# Patient Record
Sex: Male | Born: 1960 | Hispanic: Yes | Marital: Married | Smoking: Former smoker
Health system: Southern US, Community
[De-identification: ages and names within clinical notes are randomized; demographics above are authoritative.]

## PROBLEM LIST (undated history)

## (undated) DIAGNOSIS — M2550 Pain in unspecified joint: Secondary | ICD-10-CM

## (undated) DIAGNOSIS — Z9289 Personal history of other medical treatment: Secondary | ICD-10-CM

## (undated) DIAGNOSIS — R519 Headache, unspecified: Secondary | ICD-10-CM

## (undated) DIAGNOSIS — R609 Edema, unspecified: Secondary | ICD-10-CM

## (undated) DIAGNOSIS — M549 Dorsalgia, unspecified: Secondary | ICD-10-CM

## (undated) DIAGNOSIS — Z86010 Personal history of colonic polyps: Secondary | ICD-10-CM

## (undated) DIAGNOSIS — M2540 Effusion, unspecified joint: Secondary | ICD-10-CM

## (undated) DIAGNOSIS — R51 Headache: Secondary | ICD-10-CM

## (undated) DIAGNOSIS — M1990 Unspecified osteoarthritis, unspecified site: Secondary | ICD-10-CM

## (undated) DIAGNOSIS — K219 Gastro-esophageal reflux disease without esophagitis: Secondary | ICD-10-CM

## (undated) DIAGNOSIS — Z87442 Personal history of urinary calculi: Secondary | ICD-10-CM

## (undated) HISTORY — PX: SHOULDER SURGERY: SHX246

## (undated) HISTORY — PX: ESOPHAGOGASTRODUODENOSCOPY: SHX1529

## (undated) HISTORY — PX: KNEE ARTHROSCOPY: SUR90

## (undated) HISTORY — PX: OTHER SURGICAL HISTORY: SHX169

---

## 1993-02-07 DIAGNOSIS — Z9289 Personal history of other medical treatment: Secondary | ICD-10-CM

## 1993-02-07 HISTORY — DX: Personal history of other medical treatment: Z92.89

## 2015-07-12 ENCOUNTER — Encounter (HOSPITAL_COMMUNITY): Payer: Self-pay | Admitting: Emergency Medicine

## 2015-07-12 ENCOUNTER — Emergency Department (HOSPITAL_COMMUNITY): Payer: BLUE CROSS/BLUE SHIELD

## 2015-07-12 ENCOUNTER — Emergency Department (HOSPITAL_COMMUNITY)
Admission: EM | Admit: 2015-07-12 | Discharge: 2015-07-12 | Disposition: A | Discharge status: Home / Self Care | Payer: BLUE CROSS/BLUE SHIELD | Attending: Emergency Medicine | Admitting: Emergency Medicine

## 2015-07-12 DIAGNOSIS — R319 Hematuria, unspecified: Secondary | ICD-10-CM | POA: Diagnosis not present

## 2015-07-12 DIAGNOSIS — R109 Unspecified abdominal pain: Secondary | ICD-10-CM | POA: Diagnosis present

## 2015-07-12 DIAGNOSIS — N201 Calculus of ureter: Secondary | ICD-10-CM | POA: Insufficient documentation

## 2015-07-12 DIAGNOSIS — Z791 Long term (current) use of non-steroidal anti-inflammatories (NSAID): Secondary | ICD-10-CM | POA: Insufficient documentation

## 2015-07-12 DIAGNOSIS — E785 Hyperlipidemia, unspecified: Secondary | ICD-10-CM | POA: Insufficient documentation

## 2015-07-12 DIAGNOSIS — Z79899 Other long term (current) drug therapy: Secondary | ICD-10-CM | POA: Insufficient documentation

## 2015-07-12 HISTORY — DX: Gastro-esophageal reflux disease without esophagitis: K21.9

## 2015-07-12 LAB — URINALYSIS, ROUTINE W REFLEX MICROSCOPIC
Bilirubin Urine: NEGATIVE
GLUCOSE, UA: NEGATIVE mg/dL
Ketones, ur: NEGATIVE mg/dL
LEUKOCYTES UA: NEGATIVE
Nitrite: NEGATIVE
PROTEIN: NEGATIVE mg/dL
SPECIFIC GRAVITY, URINE: 1.025 (ref 1.005–1.030)
pH: 5.5 (ref 5.0–8.0)

## 2015-07-12 LAB — URINE MICROSCOPIC-ADD ON

## 2015-07-12 LAB — BASIC METABOLIC PANEL
ANION GAP: 7 (ref 5–15)
BUN: 22 mg/dL — ABNORMAL HIGH (ref 6–20)
CHLORIDE: 107 mmol/L (ref 101–111)
CO2: 23 mmol/L (ref 22–32)
Calcium: 9 mg/dL (ref 8.9–10.3)
Creatinine, Ser: 1.22 mg/dL (ref 0.61–1.24)
GFR calc non Af Amer: >60 mL/min (ref >60)
GLUCOSE: 98 mg/dL (ref 65–99)
POTASSIUM: 3.5 mmol/L (ref 3.5–5.1)
Sodium: 137 mmol/L (ref 135–145)

## 2015-07-12 LAB — LIPASE, BLOOD: LIPASE: 46 U/L (ref 11–51)

## 2015-07-12 LAB — CBC
HEMATOCRIT: 43.7 % (ref 39.0–52.0)
HEMOGLOBIN: 15.6 g/dL (ref 13.0–17.0)
MCH: 29.8 pg (ref 26.0–34.0)
MCHC: 35.7 g/dL (ref 30.0–36.0)
MCV: 83.6 fL (ref 78.0–100.0)
Platelets: 192 10*3/uL (ref 150–400)
RBC: 5.23 MIL/uL (ref 4.22–5.81)
RDW: 12.4 % (ref 11.5–15.5)
WBC: 12.7 10*3/uL — ABNORMAL HIGH (ref 4.0–10.5)

## 2015-07-12 MED ORDER — TAMSULOSIN HCL 0.4 MG PO CAPS
0.4000 mg | ORAL_CAPSULE | Freq: Once | ORAL | Status: AC
  Administered 2015-07-12: 0.4 mg via ORAL
  Filled 2015-07-12: qty 1

## 2015-07-12 MED ORDER — KETOROLAC TROMETHAMINE 30 MG/ML IJ SOLN: 30.0000 mg | Freq: Once | INTRAMUSCULAR | Status: DC

## 2015-07-12 MED ORDER — OXYCODONE-ACETAMINOPHEN 5-325 MG PO TABS: 1.0000 | ORAL_TABLET | Freq: Three times a day (TID) | ORAL | Status: DC | PRN

## 2015-07-12 MED ORDER — TAMSULOSIN HCL 0.4 MG PO CAPS: 0.4000 mg | ORAL_CAPSULE | Freq: Every day | ORAL | Status: DC

## 2015-07-12 MED ORDER — KETOROLAC TROMETHAMINE 30 MG/ML IJ SOLN
30.0000 mg | Freq: Once | INTRAMUSCULAR | Status: AC
  Administered 2015-07-12: 30 mg via INTRAVENOUS
  Filled 2015-07-12: qty 1

## 2015-07-12 MED ORDER — IBUPROFEN 800 MG PO TABS: 800.0000 mg | ORAL_TABLET | Freq: Three times a day (TID) | ORAL | Status: DC

## 2015-07-12 MED ORDER — HYDROMORPHONE HCL 1 MG/ML IJ SOLN
1.0000 mg | Freq: Once | INTRAMUSCULAR | Status: AC
  Administered 2015-07-12: 1 mg via INTRAVENOUS
  Filled 2015-07-12: qty 1

## 2015-07-12 MED ORDER — OXYCODONE-ACETAMINOPHEN 5-325 MG PO TABS
2.0000 | ORAL_TABLET | Freq: Once | ORAL | Status: AC
  Administered 2015-07-12: 2 via ORAL
  Filled 2015-07-12: qty 2

## 2015-07-12 MED ORDER — ONDANSETRON 4 MG PO TBDP: ORAL_TABLET | ORAL | Status: DC

## 2015-07-12 MED ORDER — SODIUM CHLORIDE 0.9 % IV BOLUS (SEPSIS)
1000.0000 mL | Freq: Once | INTRAVENOUS | Status: AC
  Administered 2015-07-12: 1000 mL via INTRAVENOUS

## 2015-07-12 NOTE — ED Notes (Signed)
Pt states he had sudden left flank pain with difficulty urinating at noon today.

## 2015-07-12 NOTE — ED Provider Notes (Signed)
CSN: [id number]     Arrival date & time 2015/08/07  1436 History   First MD Initiated Contact with Patient 07-Aug-2015 1457     Chief Complaint  Patient presents with  . Flank Pain     (Consider location/radiation/quality/duration/timing/severity/associated sxs/prior Treatment) Patient is a 55 y.o. male presenting with flank pain.  Flank Pain This is a new problem. The current episode started 3 to 5 hours ago. The problem occurs constantly. The problem has not changed since onset.Pertinent negatives include no chest pain and no headaches. Nothing aggravates the symptoms. Nothing relieves the symptoms. The treatment provided no relief.    Past Medical History  Diagnosis Date  . GERD (gastroesophageal reflux disease)   . Hyperlipemia   . Obesity, unspecified   . 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 Esophageal reflux   . Acute prostatitis    History reviewed. No pertinent past surgical history. History reviewed. No pertinent family history. Social History  Substance Use Topics  . Smoking status: Never Smoker   . Smokeless tobacco: None  . Alcohol Use: No    Review of Systems  Eyes: Negative for pain.  Cardiovascular: Negative for chest pain.  Endocrine: Negative for polydipsia and polyuria.  Genitourinary: Positive for hematuria, flank pain and difficulty urinating.  Skin: Negative for color change and rash.  Neurological: Negative for headaches.  All other systems reviewed and are negative.     Allergies  Review of patient's allergies indicates no known allergies.  Home Medications   Prior to Admission medications   Medication Sig Start Date End Date Taking? Authorizing Provider  lansoprazole (PREVACID) 30 MG capsule Take 30 mg by mouth daily at 12 noon.   Yes Historical Provider, MD  ibuprofen (ADVIL,MOTRIN) 800 MG tablet Take 1 tablet (800 mg total) by mouth 3 (three) times daily. August 07, 2015   Marily Memos, MD  ondansetron (ZOFRAN ODT) 4 MG disintegrating  tablet  ODT q4 hours prn nausea/vomit 08/07/2015   Marily Memos, MD  oxyCODONE-acetaminophen (PERCOCET/ROXICET) 5-325 MG tablet Take 1-2 tablets by mouth every 8 (eight) hours as needed for severe pain. Aug 07, 2015   Marily Memos, MD  tamsulosin (FLOMAX) 0.4 MG CAPS capsule Take 1 capsule (0.4 mg total) by mouth daily. Until stone has passed. 07-Aug-2015   Barbara Cower Durene Dodge, MD   BP 132/86 mmHg  Pulse 72  Temp(Src) 97.6 F (36.4 C) (Oral)  Resp 18  Ht  (1.727 m)  Wt 220 lb (99.791 kg)  BMI 33.46 kg/m2  SpO2 97% Physical Exam  Constitutional: He is oriented to person, place, and time. He appears well-developed and well-nourished.  Pacing the room, clutching left flank  HENT:  Head: Normocephalic and atraumatic.  Neck: Normal range of motion.  Cardiovascular: Normal rate.   Pulmonary/Chest: Effort normal. No respiratory distress.  Abdominal: Soft. He exhibits no distension.  Musculoskeletal: Normal range of motion. He exhibits no edema or tenderness.  Neurological: He is alert and oriented to person, place, and time.  Skin: No rash noted. No erythema.  Nursing note and vitals reviewed.   ED Course  Procedures (including critical care time) Labs Review Labs Reviewed  BASIC METABOLIC PANEL - Abnormal; Notable for the following:    BUN 22 (*)    All other components within normal limits  CBC - Abnormal; Notable for the following:    WBC 12.7 (*)    All other components within normal limits  URINALYSIS, ROUTINE W REFLEX MICROSCOPIC (NOT AT [facility]) - Abnormal; Notable for  the following:    Hgb urine dipstick LARGE (*)    All other components within normal limits  URINE MICROSCOPIC-ADD ON - Abnormal; Notable for the following:    Squamous Epithelial / LPF 0-5 (*)    Bacteria, UA FEW (*)    All other components within normal limits  LIPASE, BLOOD    Imaging Review Ct Renal Stone Study  07/12/2015  CLINICAL DATA:  Left flank pain. EXAM: CT ABDOMEN AND PELVIS WITHOUT CONTRAST TECHNIQUE:  Multidetector CT imaging of the abdomen and pelvis was performed following the standard protocol without IV contrast. COMPARISON:  None. FINDINGS: There is a 4.6 mm nodule in the periphery of the right lung on series 3, image 4. There is a small hiatal hernia. Lung bases are otherwise normal. There is a fat containing umbilical hernia. No free air or free fluid. No stones are seen in either kidney. There is mild hydronephrosis and perinephric stranding associated with the left kidney. There is also mild ureterectasis and periureteral stranding on the left. There is a 2 mm stone at the left UVJ accounting for these findings. No hydronephrosis, perinephric stranding, ureterectasis, or ureteral stones on the right. Hepatic steatosis is identified. Patient is status post cholecystectomy. The spleen, adrenal glands, and pancreas are normal. No aneurysm or adenopathy. Two calcified nodes are seen in the mesentery with no soft tissue component and no desmoplastic reaction. These no are of doubtful significance. The remainder of the stomach is normal. The small bowel is unremarkable. The colon and appendix are normal. No adenopathy or mass in the pelvis. The prostate, seminal vesicles, and remainder of the bladder normal. There is a lucent lesion in the right femoral head with a sclerotic rim/narrow zone of transition. No soft tissue component is identified. No other acute bony abnormalities. IMPRESSION: 1. 2 mm stones at the left UVJ with mild obstruction accounting for the patient's symptoms. 2. 4.6 mm nodule in the right lung base. See below for follow-up recommendations. 3. Lucent lesion with narrow zone of transition the right femoral head suggest a benign etiology. No follow-up needed if patient is low-risk. Non-contrast chest CT can be considered in 12 months if patient is high-risk. This recommendation follows the consensus statement: Guidelines for Management of Incidental Pulmonary Nodules Detected on CT  Images:From the Fleischner Society 2017; published online before print ([id number]/[id number]). Electronically Signed   By: Gerome Samavid  Williams III M.D   On: 07/12/2015 16:12   I have personally reviewed and evaluated these images and lab results as part of my medical decision-making.   EKG Interpretation None      MDM   Final diagnoses:  Ureterolithiasis   Likely kidney stone v urinary obstruction 2/2 prostatitis. Will evaluate appropriately.   On reevaluation, ct scan with e/o kidney stone as likely cause for his symptoms. Added on another liter of fluids, flomax, toradol. Pain returning a little bit. Still no nausea. Will give percocet x 2. Also ok to have PO intake now.   Pain almost resolved. Tolerating PO. Stable for dc to fu w/ urology prn if continued symptoms.   New Prescriptions: Discharge Medication List as of 07/12/2015  5:14 PM    START taking these medications   Details  ibuprofen (ADVIL,MOTRIN) 800 MG tablet Take 1 tablet (800 mg total) by mouth 3 (three) times daily., Starting 07/12/2015, Until Discontinued, Print    ondansetron (ZOFRAN ODT) 4 MG disintegrating tablet 4mg  ODT q4 hours prn nausea/vomit, Print    oxyCODONE-acetaminophen (PERCOCET/ROXICET) 5-325 MG tablet  Take 1-2 tablets by mouth every 8 (eight) hours as needed for severe pain., Starting 07-21-2015, Until Discontinued, Print    tamsulosin (FLOMAX) 0.4 MG CAPS capsule Take 1 capsule (0.4 mg total) by mouth daily. Until stone has passed., Starting July 21, 2015, Until Discontinued, Print       I have personally and contemperaneously reviewed labs and imaging and used in my decision making as above.   A medical screening exam was performed and I feel the patient has had an appropriate workup for their chief complaint at this time and likelihood of emergent condition existing is low and thus workup can continue on an outpatient basis.. Their vital signs are stable. They have been counseled on decision, discharge,  follow up and which symptoms necessitate immediate return to the emergency department.  They verbally stated understanding and agreement with plan and discharged in stable condition.      Marily Memos, MD 07/21/2015 [phone]

## 2015-10-09 NOTE — Pre-Procedure Instructions (Signed)
Tommy RileHaraldo S Odom  10/09/2015      [facility] [phone] [place] - [address] [facility] [address] [place] [id number] Phone: [phone] Fax: [phone]    Your procedure is scheduled on Fri, Sept 15 @ 10:15 AM  [facility] Admitting at 8:15 AM  Call this number if you have problems the morning of surgery:  [phone]   Remember:  Do not eat food or drink liquids after midnight.  Take these medicines the morning of surgery with A SIP OF WATER Pain Pill(if needed),Zofran(Ondansetron-if needed), and  Flomax(Tamsulosin)             Stop taking your Ibuprofen a week prior to surgery. No Goody's,BC's,Aleve,Advil,Motrin,Fish Oil,or any Herbal Medications.    Do not wear jewelry.  Do not wear lotions, powders, or colognes, or deoderant.             Men may shave face and neck.  Do not bring valuables to the hospital.  [facility] is not responsible for any belongings or valuables.  Contacts, dentures or bridgework may not be worn into surgery.  Leave your suitcase in the car.  After surgery it may be brought to your room.  For patients admitted to the hospital, discharge time will be determined by your treatment team.  Patients discharged the day of surgery will not be allowed to drive home.    [facility]  Before surgery, you can play an important role.  Because skin is not sterile, your skin needs to be as free of germs as possible.  You can reduce the number of germs on you skin by washing with CHG (chlorahexidine gluconate) soap before surgery.  CHG is an antiseptic cleaner which kills germs and bonds with the skin to continue killing germs even after washing.  Please DO NOT use if you have an allergy to CHG or antibacterial soaps.  If your skin becomes reddened/irritated stop using the CHG and inform your nurse when you arrive at Short Stay.  Do not shave (including legs  and underarms) for at least 48 hours prior to the first CHG shower.  You may shave your face.  Please follow these instructions carefully:   1.  Shower with CHG Soap the night before surgery and the                                morning of Surgery.  2.  If you choose to wash your hair, wash your hair first as usual with your       normal shampoo.  3.  After you shampoo, rinse your hair and body thoroughly to remove the                      Shampoo.  4.  Use CHG as you would any other liquid soap.  You can apply chg directly       to the skin and wash gently with scrungie or a clean washcloth.  5.  Apply the CHG Soap to your body ONLY FROM THE NECK DOWN.        Do not use on open wounds or open sores.  Avoid contact with your eyes,       ears, mouth and genitals (private parts).  Wash genitals (private parts)  with your normal soap.  6.  Wash thoroughly, paying special attention to the area where your surgery        will be performed.  7.  Thoroughly rinse your body with warm water from the neck down.  8.  DO NOT shower/wash with your normal soap after using and rinsing off       the CHG Soap.  9.  Pat yourself dry with a clean towel.            10.  Wear clean pajamas.            11.  Place clean sheets on your bed the night of your first shower and do not        sleep with pets.  Day of Surgery  Do not apply any lotions/deoderants the morning of surgery.  Please wear clean clothes to the hospital/surgery center.    Please read over the following fact sheets that you were given. MRSA Information and Surgical Site Infection Prevention

## 2015-10-13 ENCOUNTER — Encounter (HOSPITAL_COMMUNITY): Payer: Self-pay

## 2015-10-13 ENCOUNTER — Encounter (HOSPITAL_COMMUNITY)
Admission: RE | Admit: 2015-10-13 | Discharge: 2015-10-13 | Disposition: A | Discharge status: Home / Self Care | Payer: BLUE CROSS/BLUE SHIELD | Source: Ambulatory Visit | Attending: Orthopedic Surgery | Admitting: Orthopedic Surgery

## 2015-10-13 DIAGNOSIS — M1711 Unilateral primary osteoarthritis, right knee: Secondary | ICD-10-CM | POA: Diagnosis not present

## 2015-10-13 DIAGNOSIS — Z01812 Encounter for preprocedural laboratory examination: Secondary | ICD-10-CM | POA: Insufficient documentation

## 2015-10-13 HISTORY — DX: Dorsalgia, unspecified: M54.9

## 2015-10-13 HISTORY — DX: Unspecified osteoarthritis, unspecified site: M19.90

## 2015-10-13 HISTORY — DX: Headache: R51

## 2015-10-13 HISTORY — DX: Headache, unspecified: R51.9

## 2015-10-13 HISTORY — DX: Personal history of colonic polyps: Z86.010

## 2015-10-13 HISTORY — DX: Effusion, unspecified joint: M25.40

## 2015-10-13 HISTORY — DX: Personal history of urinary calculi: Z87.442

## 2015-10-13 HISTORY — DX: Pain in unspecified joint: M25.50

## 2015-10-13 HISTORY — DX: Personal history of other medical treatment: Z92.89

## 2015-10-13 LAB — CBC
HEMATOCRIT: 45.9 % (ref 39.0–52.0)
Hemoglobin: 16.2 g/dL (ref 13.0–17.0)
MCH: 30 pg (ref 26.0–34.0)
MCHC: 35.3 g/dL (ref 30.0–36.0)
MCV: 85 fL (ref 78.0–100.0)
PLATELETS: 215 10*3/uL (ref 150–400)
RBC: 5.4 MIL/uL (ref 4.22–5.81)
RDW: 12.5 % (ref 11.5–15.5)
WBC: 8 10*3/uL (ref 4.0–10.5)

## 2015-10-13 LAB — BASIC METABOLIC PANEL
Anion gap: 6 (ref 5–15)
BUN: 17 mg/dL (ref 6–20)
CHLORIDE: 107 mmol/L (ref 101–111)
CO2: 26 mmol/L (ref 22–32)
CREATININE: 1.09 mg/dL (ref 0.61–1.24)
Calcium: 9.8 mg/dL (ref 8.9–10.3)
GFR calc Af Amer: >60 mL/min (ref >60)
GFR calc non Af Amer: >60 mL/min (ref >60)
GLUCOSE: 94 mg/dL (ref 65–99)
POTASSIUM: 4.4 mmol/L (ref 3.5–5.1)
Sodium: 139 mmol/L (ref 135–145)

## 2015-10-13 LAB — SURGICAL PCR SCREEN
MRSA, PCR: NEGATIVE
STAPHYLOCOCCUS AUREUS: POSITIVE — AB

## 2015-10-13 MED ORDER — CHLORHEXIDINE GLUCONATE 4 % EX LIQD: 60.0000 mL | Freq: Once | CUTANEOUS | Status: DC

## 2015-10-13 NOTE — Progress Notes (Addendum)
Cardiologist denies  Medical Md is [facility] [place]   Echo denies  Stress test deneis  Heart cath denies  EKG denies  CXR denies

## 2015-10-13 NOTE — H&P (Signed)
  Tommy Odom is an 55 y.o. male.    Chief Complaint: right knee pain  HPI: Pt is a 55 y.o. male complaining of right knee pain for multiple years. Pain had continually increased since the beginning. X-rays in the clinic show end-stage arthritic changes of the right knee. Pt has tried various conservative treatments which have failed to alleviate their symptoms, including injections and therapy. Various options are discussed with the patient. Risks, benefits and expectations were discussed with the patient. Patient understand the risks, benefits and expectations and wishes to proceed with surgery.   PCP:  Pcp Not In System  D/C Plans: Home  PMH: Past Medical History:  Diagnosis Date  . Acute prostatitis   . 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 Esophageal reflux   . GERD (gastroesophageal reflux disease)   . Hyperlipemia   . Obesity, unspecified     PSH: No past surgical history on file.  Social History:  reports that he has never smoked. He does not have any smokeless tobacco history on file. He reports that he does not drink alcohol or use drugs.  Allergies:  No Known Allergies  Medications: No current facility-administered medications for this encounter.    Current Outpatient Prescriptions  Medication Sig Dispense Refill  . ibuprofen (ADVIL,MOTRIN) 800 MG tablet Take 1 tablet (800 mg total) by mouth 3 (three) times daily. 21 tablet 0  . lansoprazole (PREVACID) 30 MG capsule Take 30 mg by mouth daily at 12 noon.    . ondansetron (ZOFRAN ODT) 4 MG disintegrating tablet 4mg  ODT q4 hours prn nausea/vomit 15 tablet 0  . oxyCODONE-acetaminophen (PERCOCET/ROXICET) 5-325 MG tablet Take 1-2 tablets by mouth every 8 (eight) hours as needed for severe pain. 30 tablet 0  . tamsulosin (FLOMAX) 0.4 MG CAPS capsule Take 1 capsule (0.4 mg total) by mouth daily. Until stone has passed. 30 capsule 0    No results found for this or any previous visit (from the past 48  hour(s)). No results found.  ROS: Pain with rom of the right lower extremity  Physical Exam:  Alert and oriented 55 y.o. male in no acute distress Cranial nerves 2-12 intact Cervical spine: full rom with no tenderness, nv intact distally Chest: active breath sounds bilaterally, no wheeze rhonchi or rales Heart: regular rate and rhythm, no murmur Abd: non tender non distended with active bowel sounds Hip is stable with rom  Right knee with moderate crepitus and joint line tenderness nv intact distally No rashes or edema Antalgic gait  Assessment/Plan Assessment: right knee end stage osteoarthritis  Plan: Patient will undergo a right total knee athroplasty [facility]. Risks benefits and expectations were discussed with the patient. Patient understand risks, benefits and expectations and wishes to proceed.

## 2015-10-13 NOTE — Progress Notes (Signed)
Mupirocin script called into the [facility] on [address] in [place].Pt also notified this was called in.Went over instructions again on how to use and pt verbalized understanding.

## 2015-10-22 MED ORDER — CEFAZOLIN SODIUM-DEXTROSE 2-4 GM/100ML-% IV SOLN
2.0000 g | INTRAVENOUS | Status: AC
  Administered 2015-10-23: 2 g via INTRAVENOUS
  Filled 2015-10-22: qty 100

## 2015-10-23 ENCOUNTER — Inpatient Hospital Stay (HOSPITAL_COMMUNITY): Payer: Worker's Compensation | Admitting: Anesthesiology

## 2015-10-23 ENCOUNTER — Inpatient Hospital Stay (HOSPITAL_COMMUNITY): Payer: Worker's Compensation

## 2015-10-23 ENCOUNTER — Inpatient Hospital Stay (HOSPITAL_COMMUNITY)
Admission: RE | Admit: 2015-10-23 | Discharge: 2015-10-26 | DRG: 470 | Disposition: A | Discharge status: Home Health | Payer: Worker's Compensation | Source: Ambulatory Visit | Attending: Orthopedic Surgery | Admitting: Orthopedic Surgery

## 2015-10-23 ENCOUNTER — Encounter (HOSPITAL_COMMUNITY): Payer: Self-pay | Admitting: Anesthesiology

## 2015-10-23 ENCOUNTER — Encounter (HOSPITAL_COMMUNITY)
Admission: RE | Disposition: A | Discharge status: Home Health | Payer: Self-pay | Source: Ambulatory Visit | Attending: Orthopedic Surgery

## 2015-10-23 DIAGNOSIS — E669 Obesity, unspecified: Secondary | ICD-10-CM | POA: Diagnosis present

## 2015-10-23 DIAGNOSIS — R739 Hyperglycemia, unspecified: Secondary | ICD-10-CM | POA: Diagnosis present

## 2015-10-23 DIAGNOSIS — E785 Hyperlipidemia, unspecified: Secondary | ICD-10-CM | POA: Diagnosis present

## 2015-10-23 DIAGNOSIS — M1711 Unilateral primary osteoarthritis, right knee: Secondary | ICD-10-CM | POA: Diagnosis present

## 2015-10-23 DIAGNOSIS — K219 Gastro-esophageal reflux disease without esophagitis: Secondary | ICD-10-CM | POA: Diagnosis present

## 2015-10-23 DIAGNOSIS — M659 Synovitis and tenosynovitis, unspecified: Secondary | ICD-10-CM | POA: Diagnosis present

## 2015-10-23 DIAGNOSIS — Z79899 Other long term (current) drug therapy: Secondary | ICD-10-CM | POA: Diagnosis not present

## 2015-10-23 DIAGNOSIS — Z6834 Body mass index (BMI) 34.0-34.9, adult: Secondary | ICD-10-CM

## 2015-10-23 DIAGNOSIS — M25561 Pain in right knee: Secondary | ICD-10-CM | POA: Diagnosis present

## 2015-10-23 DIAGNOSIS — Z7409 Other reduced mobility: Secondary | ICD-10-CM

## 2015-10-23 DIAGNOSIS — M1731 Unilateral post-traumatic osteoarthritis, right knee: Secondary | ICD-10-CM | POA: Diagnosis present

## 2015-10-23 DIAGNOSIS — Z96651 Presence of right artificial knee joint: Secondary | ICD-10-CM

## 2015-10-23 DIAGNOSIS — Z96659 Presence of unspecified artificial knee joint: Secondary | ICD-10-CM

## 2015-10-23 HISTORY — PX: TOTAL KNEE ARTHROPLASTY: SHX125

## 2015-10-23 LAB — CBC
HEMATOCRIT: 43.1 % (ref 39.0–52.0)
HEMOGLOBIN: 15.2 g/dL (ref 13.0–17.0)
MCH: 29.7 pg (ref 26.0–34.0)
MCHC: 35.3 g/dL (ref 30.0–36.0)
MCV: 84.3 fL (ref 78.0–100.0)
Platelets: 192 10*3/uL (ref 150–400)
RBC: 5.11 MIL/uL (ref 4.22–5.81)
RDW: 12.3 % (ref 11.5–15.5)
WBC: 17.1 10*3/uL — ABNORMAL HIGH (ref 4.0–10.5)

## 2015-10-23 LAB — CREATININE, SERUM
Creatinine, Ser: 1 mg/dL (ref 0.61–1.24)
GFR calc Af Amer: >60 mL/min (ref >60)

## 2015-10-23 SURGERY — ARTHROPLASTY, KNEE, TOTAL
Anesthesia: Regional | Laterality: Right

## 2015-10-23 MED ORDER — FENTANYL CITRATE (PF) 100 MCG/2ML IJ SOLN
INTRAMUSCULAR | Status: AC
  Administered 2015-10-23: 100 ug
  Filled 2015-10-23: qty 2

## 2015-10-23 MED ORDER — METHOCARBAMOL 500 MG PO TABS
ORAL_TABLET | ORAL | Status: AC
  Filled 2015-10-23: qty 1

## 2015-10-23 MED ORDER — WARFARIN - PHARMACIST DOSING INPATIENT
Freq: Every day | Status: DC
  Administered 2015-10-23: 1

## 2015-10-23 MED ORDER — PROPOFOL 500 MG/50ML IV EMUL
INTRAVENOUS | Status: DC | PRN
  Administered 2015-10-23: 100 ug/kg/min via INTRAVENOUS

## 2015-10-23 MED ORDER — WARFARIN SODIUM 5 MG PO TABS: 5.0000 mg | ORAL_TABLET | Freq: Every day | ORAL | 0 refills | Status: DC

## 2015-10-23 MED ORDER — METHOCARBAMOL 1000 MG/10ML IJ SOLN
500.0000 mg | Freq: Four times a day (QID) | INTRAVENOUS | Status: DC | PRN
  Filled 2015-10-23: qty 5

## 2015-10-23 MED ORDER — ONDANSETRON HCL 4 MG/2ML IJ SOLN: 4.0000 mg | Freq: Once | INTRAMUSCULAR | Status: DC | PRN

## 2015-10-23 MED ORDER — ACETAMINOPHEN 325 MG PO TABS: 650.0000 mg | ORAL_TABLET | Freq: Four times a day (QID) | ORAL | Status: DC | PRN

## 2015-10-23 MED ORDER — METHOCARBAMOL 500 MG PO TABS
500.0000 mg | ORAL_TABLET | Freq: Four times a day (QID) | ORAL | Status: DC | PRN
  Administered 2015-10-23 – 2015-10-26 (×4): 500 mg via ORAL
  Filled 2015-10-23 (×4): qty 1

## 2015-10-23 MED ORDER — HYDROMORPHONE HCL 1 MG/ML IJ SOLN
0.5000 mg | INTRAMUSCULAR | Status: DC | PRN
  Administered 2015-10-23 (×2): 0.5 mg via INTRAVENOUS

## 2015-10-23 MED ORDER — BISACODYL 10 MG RE SUPP: 10.0000 mg | Freq: Every day | RECTAL | Status: DC | PRN

## 2015-10-23 MED ORDER — OXYCODONE-ACETAMINOPHEN 7.5-325 MG PO TABS: 1.0000 | ORAL_TABLET | ORAL | 0 refills | Status: AC | PRN

## 2015-10-23 MED ORDER — FENTANYL CITRATE (PF) 100 MCG/2ML IJ SOLN
INTRAMUSCULAR | Status: AC
  Filled 2015-10-23: qty 2

## 2015-10-23 MED ORDER — POLYETHYLENE GLYCOL 3350 17 G PO PACK: 17.0000 g | PACK | Freq: Every day | ORAL | Status: DC | PRN

## 2015-10-23 MED ORDER — ONDANSETRON HCL 4 MG PO TABS: 4.0000 mg | ORAL_TABLET | Freq: Four times a day (QID) | ORAL | Status: DC | PRN

## 2015-10-23 MED ORDER — DOCUSATE SODIUM 100 MG PO CAPS
100.0000 mg | ORAL_CAPSULE | Freq: Two times a day (BID) | ORAL | Status: DC
  Administered 2015-10-23 – 2015-10-26 (×6): 100 mg via ORAL
  Filled 2015-10-23 (×6): qty 1

## 2015-10-23 MED ORDER — SODIUM CHLORIDE 0.9 % IV SOLN
INTRAVENOUS | Status: DC | PRN
  Administered 2015-10-23: 2000 mg via TOPICAL

## 2015-10-23 MED ORDER — ACETAMINOPHEN 650 MG RE SUPP
650.0000 mg | Freq: Four times a day (QID) | RECTAL | Status: DC | PRN
Start: 2015-10-23 — End: 2015-10-26

## 2015-10-23 MED ORDER — CEFAZOLIN SODIUM-DEXTROSE 2-4 GM/100ML-% IV SOLN
2.0000 g | Freq: Four times a day (QID) | INTRAVENOUS | Status: AC
Start: 2015-10-23 — End: 2015-10-23
  Administered 2015-10-23 (×2): 2 g via INTRAVENOUS
  Filled 2015-10-23 (×2): qty 100

## 2015-10-23 MED ORDER — MIDAZOLAM HCL 2 MG/2ML IJ SOLN
INTRAMUSCULAR | Status: AC
  Filled 2015-10-23: qty 2

## 2015-10-23 MED ORDER — PHENYLEPHRINE HCL 10 MG/ML IJ SOLN
INTRAMUSCULAR | Status: DC | PRN
  Administered 2015-10-23: 25 ug/min via INTRAVENOUS

## 2015-10-23 MED ORDER — METOCLOPRAMIDE HCL 5 MG/ML IJ SOLN: 5.0000 mg | Freq: Three times a day (TID) | INTRAMUSCULAR | Status: DC | PRN

## 2015-10-23 MED ORDER — ENOXAPARIN SODIUM 40 MG/0.4ML ~~LOC~~ SOLN: 40.0000 mg | SUBCUTANEOUS | 0 refills | Status: DC

## 2015-10-23 MED ORDER — TRANEXAMIC ACID 1000 MG/10ML IV SOLN
2000.0000 mg | INTRAVENOUS | Status: DC
  Filled 2015-10-23: qty 20

## 2015-10-23 MED ORDER — HYDROMORPHONE HCL 1 MG/ML IJ SOLN
1.0000 mg | INTRAMUSCULAR | Status: DC | PRN
  Administered 2015-10-23 – 2015-10-24 (×5): 2 mg via INTRAVENOUS
  Administered 2015-10-24: 1 mg via INTRAVENOUS
  Filled 2015-10-23 (×6): qty 2

## 2015-10-23 MED ORDER — FERROUS SULFATE 325 (65 FE) MG PO TABS
325.0000 mg | ORAL_TABLET | Freq: Three times a day (TID) | ORAL | Status: DC
  Administered 2015-10-23 – 2015-10-26 (×8): 325 mg via ORAL
  Filled 2015-10-23 (×9): qty 1

## 2015-10-23 MED ORDER — WARFARIN SODIUM 7.5 MG PO TABS
7.5000 mg | ORAL_TABLET | Freq: Once | ORAL | Status: AC
  Administered 2015-10-23: 7.5 mg via ORAL
  Filled 2015-10-23: qty 1

## 2015-10-23 MED ORDER — ENOXAPARIN SODIUM 30 MG/0.3ML ~~LOC~~ SOLN
30.0000 mg | Freq: Two times a day (BID) | SUBCUTANEOUS | Status: DC
  Administered 2015-10-24 – 2015-10-26 (×5): 30 mg via SUBCUTANEOUS
  Filled 2015-10-23 (×5): qty 0.3

## 2015-10-23 MED ORDER — SODIUM CHLORIDE 0.9 % IV SOLN
INTRAVENOUS | Status: DC
  Administered 2015-10-23: 50 mL/h via INTRAVENOUS

## 2015-10-23 MED ORDER — PHENOL 1.4 % MT LIQD: 1.0000 | OROMUCOSAL | Status: DC | PRN

## 2015-10-23 MED ORDER — PANTOPRAZOLE SODIUM 40 MG PO TBEC
40.0000 mg | DELAYED_RELEASE_TABLET | Freq: Every day | ORAL | Status: DC
  Administered 2015-10-24: 40 mg via ORAL
  Filled 2015-10-23: qty 1

## 2015-10-23 MED ORDER — FENTANYL CITRATE (PF) 100 MCG/2ML IJ SOLN
INTRAMUSCULAR | Status: DC | PRN
  Administered 2015-10-23 (×2): 50 ug via INTRAVENOUS

## 2015-10-23 MED ORDER — TRANEXAMIC ACID 1000 MG/10ML IV SOLN
1000.0000 mg | INTRAVENOUS | Status: AC
  Administered 2015-10-23: 1000 mg via INTRAVENOUS
  Filled 2015-10-23: qty 10

## 2015-10-23 MED ORDER — MENTHOL 3 MG MT LOZG: 1.0000 | LOZENGE | OROMUCOSAL | Status: DC | PRN

## 2015-10-23 MED ORDER — SODIUM CHLORIDE 0.9 % IR SOLN
Status: DC | PRN
  Administered 2015-10-23: 1000 mL

## 2015-10-23 MED ORDER — ONDANSETRON HCL 4 MG/2ML IJ SOLN
4.0000 mg | Freq: Four times a day (QID) | INTRAMUSCULAR | Status: DC | PRN
  Administered 2015-10-24: 4 mg via INTRAVENOUS
  Filled 2015-10-23: qty 2

## 2015-10-23 MED ORDER — METOCLOPRAMIDE HCL 5 MG PO TABS: 5.0000 mg | ORAL_TABLET | Freq: Three times a day (TID) | ORAL | Status: DC | PRN

## 2015-10-23 MED ORDER — OXYCODONE HCL 5 MG PO TABS
5.0000 mg | ORAL_TABLET | ORAL | Status: DC | PRN
  Administered 2015-10-23 (×2): 10 mg via ORAL
  Administered 2015-10-23: 5 mg via ORAL
  Filled 2015-10-23 (×2): qty 2

## 2015-10-23 MED ORDER — LACTATED RINGERS IV SOLN
INTRAVENOUS | Status: DC
  Administered 2015-10-23: 09:00:00 via INTRAVENOUS

## 2015-10-23 MED ORDER — MIDAZOLAM HCL 5 MG/5ML IJ SOLN
INTRAMUSCULAR | Status: DC | PRN
  Administered 2015-10-23 (×2): 1 mg via INTRAVENOUS

## 2015-10-23 MED ORDER — HYDROMORPHONE HCL 1 MG/ML IJ SOLN
INTRAMUSCULAR | Status: AC
  Filled 2015-10-23: qty 2

## 2015-10-23 MED ORDER — METHOCARBAMOL 500 MG PO TABS: 500.0000 mg | ORAL_TABLET | Freq: Three times a day (TID) | ORAL | 1 refills | Status: AC | PRN

## 2015-10-23 MED ORDER — OXYCODONE HCL 5 MG PO TABS
ORAL_TABLET | ORAL | Status: AC
Start: 2015-10-23 — End: 2015-10-24
  Filled 2015-10-23: qty 2

## 2015-10-23 SURGICAL SUPPLY — 59 items
BANDAGE ESMARK 6X9 LF (GAUZE/BANDAGES/DRESSINGS) ×1 IMPLANT
BLADE SAG 18X100X1.27 (BLADE) IMPLANT
BLADE SAW SGTL 13X75X1.27 (BLADE) ×3 IMPLANT
BLADE SAW SGTL 18X1.27X75 (BLADE) ×2 IMPLANT
BLADE SAW SGTL 18X1.27X75MM (BLADE) ×1
BNDG ELASTIC 6X10 VLCR STRL LF (GAUZE/BANDAGES/DRESSINGS) ×3 IMPLANT
BNDG ESMARK 6X9 LF (GAUZE/BANDAGES/DRESSINGS) ×3
BNDG GAUZE ELAST 4 BULKY (GAUZE/BANDAGES/DRESSINGS) ×6 IMPLANT
BOWL SMART MIX CTS (DISPOSABLE) ×3 IMPLANT
CAPT KNEE TOTAL 3 ATTUNE ×3 IMPLANT
CEMENT HV SMART SET (Cement) ×6 IMPLANT
CLOSURE WOUND 1/2 X4 (GAUZE/BANDAGES/DRESSINGS)
COVER SURGICAL LIGHT HANDLE (MISCELLANEOUS) ×3 IMPLANT
CUFF TOURNIQUET SINGLE 34IN LL (TOURNIQUET CUFF) ×3 IMPLANT
CUFF TOURNIQUET SINGLE 44IN (TOURNIQUET CUFF) IMPLANT
DRAPE EXTREMITY T 121X128X90 (DRAPE) ×3 IMPLANT
DRAPE IMP U-DRAPE 54X76 (DRAPES) ×3 IMPLANT
DRAPE PROXIMA HALF (DRAPES) ×3 IMPLANT
DRAPE U-SHAPE 47X51 STRL (DRAPES) ×3 IMPLANT
DRSG ADAPTIC 3X8 NADH LF (GAUZE/BANDAGES/DRESSINGS) ×3 IMPLANT
DRSG PAD ABDOMINAL 8X10 ST (GAUZE/BANDAGES/DRESSINGS) ×6 IMPLANT
DURAPREP 26ML APPLICATOR (WOUND CARE) ×6 IMPLANT
ELECT CAUTERY BLADE 6.4 (BLADE) ×3 IMPLANT
ELECT REM PT RETURN 9FT ADLT (ELECTROSURGICAL) ×3
ELECTRODE REM PT RTRN 9FT ADLT (ELECTROSURGICAL) ×1 IMPLANT
GAUZE SPONGE 4X4 12PLY STRL (GAUZE/BANDAGES/DRESSINGS) ×3 IMPLANT
GLOVE BIOGEL PI ORTHO PRO 7.5 (GLOVE) ×2
GLOVE BIOGEL PI ORTHO PRO SZ8 (GLOVE) ×2
GLOVE ORTHO TXT STRL SZ7.5 (GLOVE) ×3 IMPLANT
GLOVE PI ORTHO PRO STRL 7.5 (GLOVE) ×1 IMPLANT
GLOVE PI ORTHO PRO STRL SZ8 (GLOVE) ×1 IMPLANT
GLOVE SURG ORTHO 8.5 STRL (GLOVE) ×3 IMPLANT
GOWN STRL REUS W/ TWL XL LVL3 (GOWN DISPOSABLE) ×3 IMPLANT
GOWN STRL REUS W/TWL XL LVL3 (GOWN DISPOSABLE) ×6
HANDPIECE INTERPULSE COAX TIP (DISPOSABLE) ×2
IMMOBILIZER KNEE 22 UNIV (SOFTGOODS) IMPLANT
KIT BASIN OR (CUSTOM PROCEDURE TRAY) ×3 IMPLANT
KIT MANIFOLD (MISCELLANEOUS) ×3 IMPLANT
KIT ROOM TURNOVER OR (KITS) ×3 IMPLANT
MANIFOLD NEPTUNE II (INSTRUMENTS) ×3 IMPLANT
NS IRRIG 1000ML POUR BTL (IV SOLUTION) ×3 IMPLANT
PACK TOTAL JOINT (CUSTOM PROCEDURE TRAY) ×3 IMPLANT
PACK UNIVERSAL I (CUSTOM PROCEDURE TRAY) ×3 IMPLANT
PAD ARMBOARD 7.5X6 YLW CONV (MISCELLANEOUS) ×6 IMPLANT
SET HNDPC FAN SPRY TIP SCT (DISPOSABLE) ×1 IMPLANT
STRIP CLOSURE SKIN 1/2X4 (GAUZE/BANDAGES/DRESSINGS) IMPLANT
SUCTION FRAZIER HANDLE 10FR (MISCELLANEOUS) ×2
SUCTION TUBE FRAZIER 10FR DISP (MISCELLANEOUS) ×1 IMPLANT
SUT MNCRL AB 3-0 PS2 18 (SUTURE) ×3 IMPLANT
SUT VIC AB 0 CT1 27 (SUTURE) ×4
SUT VIC AB 0 CT1 27XBRD ANBCTR (SUTURE) ×2 IMPLANT
SUT VIC AB 1 CT1 27 (SUTURE) ×6
SUT VIC AB 1 CT1 27XBRD ANBCTR (SUTURE) ×3 IMPLANT
SUT VIC AB 2-0 CT1 27 (SUTURE) ×4
SUT VIC AB 2-0 CT1 TAPERPNT 27 (SUTURE) ×2 IMPLANT
TOWEL OR 17X24 6PK STRL BLUE (TOWEL DISPOSABLE) ×3 IMPLANT
TOWEL OR 17X26 10 PK STRL BLUE (TOWEL DISPOSABLE) ×3 IMPLANT
TRAY FOLEY CATH 16FRSI W/METER (SET/KITS/TRAYS/PACK) IMPLANT
WATER STERILE IRR 1000ML POUR (IV SOLUTION) ×3 IMPLANT

## 2015-10-23 NOTE — Progress Notes (Signed)
Orthopedic Tech Progress Note Patient Details:  Tommy RileHaraldo S Odom 05/03/1960 [id number]  Patient ID: Tommy RileHaraldo S Odom, male   DOB: 03/14/1960, 55 y.o.   MRN: [id number] Pt already in cpm  Trinna PostMartinez, Reyn Faivre J 10/23/2015, 8:10 PM

## 2015-10-23 NOTE — Anesthesia Procedure Notes (Signed)
Procedure Name: MAC Date/Time: 10/23/2015 9:56 AM Performed by: Quentin OreWALKER, Nasiya Pascual E Pre-anesthesia Checklist: Patient identified, Emergency Drugs available, Suction available and Patient being monitored Patient Re-evaluated:Patient Re-evaluated prior to inductionOxygen Delivery Method: Simple face mask Intubation Type: IV induction Placement Confirmation: positive ETCO2

## 2015-10-23 NOTE — Anesthesia Procedure Notes (Signed)
Anesthesia Regional Block:  Adductor canal block  Pre-Anesthetic Checklist: ,, timeout performed, Correct Patient, Correct Site, Correct Laterality, Correct Procedure, Correct Position, site marked, Risks and benefits discussed,  Surgical consent,  Pre-op evaluation,  At surgeon's request and post-op pain management  Laterality: Right  Prep: chloraprep       Needles:   Needle Type: Stimulator Needle - 80        Needle insertion depth: 9 cm   Additional Needles:  Procedures: ultrasound guided (picture in chart) Adductor canal block Narrative:  Start time: 10/23/2015 9:45 AM End time: 10/23/2015 9:50 AM Injection made incrementally with aspirations every 5 mL.  Performed by: Personally  Anesthesiologist: Laverle HobbySMITH, Vola Beneke  Additional Notes: Pt accepts procedure w/ risks. 25cc 0.5% Marcaine w/ epi w/o difficulty. GES

## 2015-10-23 NOTE — Care Management Note (Addendum)
Case Management Note  Patient Details  Name: Geralynn RileHaraldo S Degraff MRN: [id number] Date of Birth: 04/25/1960  Subjective/Objective:    Right Total Knee Arthroplasty              Action/Plan: Discharge Planning:  NCM spoke to pt at bedside. 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 States NCM can arrange DME and HH with accepting agency. Spoke to [place], [employer] and he will try to get DME approved for delivery. Will most likely be Monday before authorizations are complete. Will arrange [facility] with [facility] (will call with referral once orders received). [facility] cannot accept referral.   Faxed op notes to [employer]'s [employer] Sports coachCase Manager. [facility]. Will fax to [employer] Case Manager once received.    Attending please enter DME orders and HH PT orders.   Expected Discharge Date:                 Expected Discharge Plan:  Home w Home Health Services  In-House Referral:  NA  Discharge planning Services  CM Consult  Post Acute Care Choice:  Home Health Choice offered to:     DME Arranged:  3-N-1, Walker rolling, CPM DME Agency:     HH Arranged:  PT HH Agency:     Status of Service:  In process, will continue to follow  If discussed at Long Length of Stay Meetings, dates discussed:    Additional Comments:  Elliot CousinShavis, Chisa Kushner Ellen, RN 10/23/2015, 6:21 PM

## 2015-10-23 NOTE — Interval H&P Note (Signed)
History and Physical Interval Note:  10/23/2015 9:30 AM  Tommy Odom  has presented today for surgery, with the diagnosis of RIGHT KNEE POST TRAUMATIC OSTEOARTRITIS  The various methods of treatment have been discussed with the patient and family. After consideration of risks, benefits and other options for treatment, the patient has consented to  Procedure(s): TOTAL KNEE ARTHROPLASTY (Right) as a surgical intervention .  The patient's history has been reviewed, patient examined, no change in status, stable for surgery.  I have reviewed the patient's chart and labs.  Questions were answered to the patient's satisfaction.     Peter Keyworth,STEVEN R
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
## 2015-10-23 NOTE — Brief Op Note (Signed)
10/23/2015  12:27 PM  PATIENT:  Tommy Odom  55 y.o. male  PRE-OPERATIVE DIAGNOSIS:  RIGHT KNEE POST TRAUMATIC OSTEOARTRITIS  POST-OPERATIVE DIAGNOSIS:  Same  PROCEDURE:  Procedure(s): TOTAL KNEE ARTHROPLASTY (Right) DePuy Attune prosthesis  SURGEON:  Surgeon(s) and Role:    * Beverely LowSteve Lucendia Leard, MD - Primary  PHYSICIAN ASSISTANT:   ASSISTANTS: Thea Gisthomas B Dixon, PA-C   ANESTHESIA:   regional and spinal  EBL:  Total I/O In: 800 [I.V.:800] Out: 400 [Urine:350; Blood:50]  BLOOD ADMINISTERED:none  DRAINS: none   LOCAL MEDICATIONS USED:  NONE  SPECIMEN:  No Specimen  DISPOSITION OF SPECIMEN:  N/A  COUNTS:  YES  TOURNIQUET:   Total Tourniquet Time Documented: Thigh (Right) - 103 minutes Total: Thigh (Right) - 103 minutes   DICTATION: .Other Dictation: Dictation Number [id number]  PLAN OF CARE: Admit to inpatient   PATIENT DISPOSITION:  PACU - hemodynamically stable.   Delay start of Pharmacological VTE agent (>24hrs) due to surgical blood loss or risk of bleeding: no

## 2015-10-23 NOTE — Anesthesia Preprocedure Evaluation (Signed)
Anesthesia Evaluation  Patient identified by MRN, date of birth, ID band Patient awake    Reviewed: Allergy & Precautions, NPO status , Patient's Chart, lab work & pertinent test results  Airway Mallampati: I       Dental   Pulmonary    Pulmonary exam normal        Cardiovascular Normal cardiovascular exam     Neuro/Psych  Headaches,    GI/Hepatic GERD  ,  Endo/Other    Renal/GU      Musculoskeletal  (+) Arthritis ,   Abdominal   Peds  Hematology   Anesthesia Other Findings   Reproductive/Obstetrics                             Anesthesia Physical Anesthesia Plan  ASA: I  Anesthesia Plan:    Post-op Pain Management:    Induction:   Airway Management Planned:   Additional Equipment:   Intra-op Plan:   Post-operative Plan:   Informed Consent:   Plan Discussed with:   Anesthesia Plan Comments:         Anesthesia Quick Evaluation

## 2015-10-23 NOTE — Anesthesia Postprocedure Evaluation (Signed)
Anesthesia Post Note  Patient: Mercie EonHaraldo S Keatts  Procedure(s) Performed: Procedure(s) (LRB): TOTAL KNEE ARTHROPLASTY (Right)  Patient location during evaluation: PACU Anesthesia Type: Spinal Level of consciousness: awake, oriented, sedated and patient cooperative Pain management: pain level controlled Vital Signs Assessment: post-procedure vital signs reviewed and stable Respiratory status: spontaneous breathing and respiratory function stable Cardiovascular status: stable Anesthetic complications: no    Last Vitals:  Vitals:   10/23/15 0816  BP: (!) 154/98  Pulse: 68  Resp: 18  Temp: 36.7 C    Last Pain:  Vitals:   10/23/15 0918  TempSrc:   PainSc: 8                  Nieve Rojero EDWARD

## 2015-10-23 NOTE — Anesthesia Procedure Notes (Signed)
Spinal  Patient location during procedure: OR Start time: 10/23/2015 9:55 AM End time: 10/23/2015 10:00 AM Staffing Anesthesiologist: Laverle HobbySMITH, Naira Standiford Preanesthetic Checklist Completed: patient identified, site marked, surgical consent, pre-op evaluation, timeout performed, IV checked, risks and benefits discussed and monitors and equipment checked Spinal Block Patient position: right lateral decubitus Prep: DuraPrep Patient monitoring: heart rate, cardiac monitor, continuous pulse ox and blood pressure Approach: midline Location: L3-4 Injection technique: single-shot Needle Needle gauge: 22 G Needle length: 9 cm Needle insertion depth: 5 cm Assessment Sensory level: T10 Additional Notes Pt accept procedure and risk. 12mg  0.5% Marcaine w/ epi. CSF clear Free flow. GES

## 2015-10-23 NOTE — Transfer of Care (Signed)
Immediate Anesthesia Transfer of Care Note  Patient: Tommy Odom  Procedure(s) Performed: Procedure(s): TOTAL KNEE ARTHROPLASTY (Right)  Patient Location: PACU  Anesthesia Type:Spinal  Level of Consciousness: awake, alert  and oriented  Airway & Oxygen Therapy: Patient Spontanous Breathing and Patient connected to nasal cannula oxygen  Post-op Assessment: Report given to RN, Post -op Vital signs reviewed and stable and Patient moving all extremities X 4  Post vital signs: Reviewed and stable  Last Vitals:  Vitals:   10/23/15 0816  BP: (!) 154/98  Pulse: 68  Resp: 18  Temp: 36.7 C    Last Pain:  Vitals:   10/23/15 0918  TempSrc:   PainSc: 8       Patients Stated Pain Goal: 10 (10/23/15 0918)  Complications: No apparent anesthesia complications

## 2015-10-23 NOTE — Progress Notes (Signed)
Orthopedic Tech Progress Note Patient Details:  Tommy Odom 09/11/1960 [id number]  CPM Right Knee CPM Right Knee: On Right Knee Flexion (Degrees): 90 Right Knee Extension (Degrees): 0 Additional Comments: Trapeze bar   Saul FordyceJennifer C Dailey Buccheri 10/23/2015, 2:27 PM

## 2015-10-24 LAB — BASIC METABOLIC PANEL
ANION GAP: 6 (ref 5–15)
BUN: 17 mg/dL (ref 6–20)
CALCIUM: 8.8 mg/dL — AB (ref 8.9–10.3)
CO2: 26 mmol/L (ref 22–32)
Chloride: 104 mmol/L (ref 101–111)
Creatinine, Ser: 1 mg/dL (ref 0.61–1.24)
Glucose, Bld: 173 mg/dL — ABNORMAL HIGH (ref 65–99)
POTASSIUM: 4.1 mmol/L (ref 3.5–5.1)
Sodium: 136 mmol/L (ref 135–145)

## 2015-10-24 LAB — GLUCOSE, CAPILLARY
GLUCOSE-CAPILLARY: 139 mg/dL — AB (ref 65–99)
GLUCOSE-CAPILLARY: 133 mg/dL — AB (ref 65–99)
Glucose-Capillary: 149 mg/dL — ABNORMAL HIGH (ref 65–99)
Glucose-Capillary: 129 mg/dL — ABNORMAL HIGH (ref 65–99)

## 2015-10-24 LAB — CBC
HEMATOCRIT: 40.8 % (ref 39.0–52.0)
Hemoglobin: 14.3 g/dL (ref 13.0–17.0)
MCH: 29.5 pg (ref 26.0–34.0)
MCHC: 35 g/dL (ref 30.0–36.0)
MCV: 84.3 fL (ref 78.0–100.0)
Platelets: 204 10*3/uL (ref 150–400)
RBC: 4.84 MIL/uL (ref 4.22–5.81)
RDW: 12.6 % (ref 11.5–15.5)
WBC: 15.1 10*3/uL — AB (ref 4.0–10.5)

## 2015-10-24 LAB — PROTIME-INR
INR: 1.17
Prothrombin Time: 15 seconds (ref 11.4–15.2)

## 2015-10-24 MED ORDER — INSULIN ASPART 100 UNIT/ML ~~LOC~~ SOLN
0.0000 [IU] | Freq: Three times a day (TID) | SUBCUTANEOUS | Status: DC
  Administered 2015-10-24 – 2015-10-26 (×3): 1 [IU] via SUBCUTANEOUS

## 2015-10-24 MED ORDER — OXYCODONE HCL 5 MG PO TABS
10.0000 mg | ORAL_TABLET | ORAL | Status: DC | PRN
  Administered 2015-10-24 – 2015-10-26 (×9): 20 mg via ORAL
  Filled 2015-10-24 (×9): qty 4

## 2015-10-24 MED ORDER — WARFARIN SODIUM 7.5 MG PO TABS
7.5000 mg | ORAL_TABLET | Freq: Once | ORAL | Status: AC
  Administered 2015-10-24: 7.5 mg via ORAL
  Filled 2015-10-24: qty 1

## 2015-10-24 MED ORDER — COUMADIN BOOK
Freq: Once | Status: AC
  Administered 2015-10-24: 12:00:00
  Filled 2015-10-24: qty 1

## 2015-10-24 MED ORDER — INSULIN ASPART 100 UNIT/ML ~~LOC~~ SOLN
3.0000 [IU] | Freq: Three times a day (TID) | SUBCUTANEOUS | Status: DC
  Administered 2015-10-24 – 2015-10-26 (×5): 3 [IU] via SUBCUTANEOUS

## 2015-10-24 MED ORDER — WARFARIN VIDEO: Freq: Once | Status: DC

## 2015-10-24 MED ORDER — HYDROMORPHONE HCL 2 MG PO TABS
2.0000 mg | ORAL_TABLET | ORAL | Status: DC | PRN
  Administered 2015-10-24 (×2): 2 mg via ORAL
  Filled 2015-10-24 (×2): qty 1

## 2015-10-24 NOTE — Progress Notes (Signed)
RN called last shift's RN to clarify if Coumadin was given no answer. This RN provided patient with Coumadin Booklet and pharmacist educated patient on the medication. Coumadin was given as ordered for 10/24/2015.

## 2015-10-24 NOTE — Progress Notes (Signed)
Orthopedic Tech Progress Note Patient Details:  Tommy Odom 06/17/1960 [id number] Ortho visit put on cpm at 1835 Patient ID: Tommy Odom, male   DOB: 05/09/1960, 55 y.o.   MRN: [id number]   Jennye MoccasinHughes, Christoph Copelan Craig 10/24/2015, 6:37 PM

## 2015-10-24 NOTE — Discharge Instructions (Signed)
Ice to the knee as much as possible to reduce inflammation.  WBAT right LE  Keep the incision clean and dry and covered for one week, then ok to get it wet in the shower  Do exercises every hour for 10 minutes.  Do NOT prop anything behind the knee.  Prop under the ankle to encourage knee extension, sleep in the knee immobilizer at night to maintain extension while sleeping  Use CPM daily for 6 hours, use in 2 hour sessions, 0-90 degrees, increase as tolerated  Follow up in the office in two weeks, call [phone] for appointment   Information on my medicine - Coumadin   (Warfarin)  This medication education was reviewed with me or my healthcare representative as part of my discharge preparation.  Why was Coumadin prescribed for you? Coumadin was prescribed for you because you have a blood clot or a medical condition that can cause an increased risk of forming blood clots. Blood clots can cause serious health problems by blocking the flow of blood to the heart, lung, or brain. Coumadin can prevent harmful blood clots from forming. As a reminder your indication for Coumadin is:   Blood Clot Prevention After Orthopedic Surgery  What test will check on my response to Coumadin? While on Coumadin (warfarin) you will need to have an INR test regularly to ensure that your dose is keeping you in the desired range. The INR (international normalized ratio) number is calculated from the result of the laboratory test called prothrombin time (PT).  If an INR APPOINTMENT HAS NOT ALREADY BEEN MADE FOR YOU please schedule an appointment to have this lab work done by your health care provider within 7 days. Your INR goal is usually a number between:  2 to 3 or your provider may give you a more narrow range like 2-2.5.  Ask your health care provider during an office visit what your goal INR is.  What  do you need to  know  About  COUMADIN? Take Coumadin (warfarin) exactly as prescribed by your healthcare  provider about the same time each day.  DO NOT stop taking without talking to the doctor who prescribed the medication.  Stopping without other blood clot prevention medication to take the place of Coumadin may increase your risk of developing a new clot or stroke.  Get refills before you run out.  What do you do if you miss a dose? If you miss a dose, take it as soon as you remember on the same day then continue your regularly scheduled regimen the next day.  Do not take two doses of Coumadin at the same time.  Important Safety Information A possible side effect of Coumadin (Warfarin) is an increased risk of bleeding. You should call your healthcare provider right away if you experience any of the following: ? Bleeding from an injury or your nose that does not stop. ? Unusual colored urine (red or dark brown) or unusual colored stools (red or black). ? Unusual bruising for unknown reasons. ? A serious fall or if you hit your head (even if there is no bleeding).  Some foods or medicines interact with Coumadin (warfarin) and might alter your response to warfarin. To help avoid this: ? Eat a balanced diet, maintaining a consistent amount of Vitamin K. ? Notify your provider about major diet changes you plan to make. ? Avoid alcohol or limit your intake to 1 drink for women and 2 drinks for men per day. (1 drink  is 5 oz. wine, 12 oz. beer, or 1.5 oz. liquor.)  Make sure that ANY health care provider who prescribes medication for you knows that you are taking Coumadin (warfarin).  Also make sure the healthcare provider who is monitoring your Coumadin knows when you have started a new medication including herbals and non-prescription products.  Coumadin (Warfarin)  Major Drug Interactions  Increased Warfarin Effect Decreased Warfarin Effect  Alcohol (large quantities) Antibiotics (esp. Septra/Bactrim, Flagyl, Cipro) Amiodarone (Cordarone) Aspirin (ASA) Cimetidine (Tagamet) Megestrol  (Megace) NSAIDs (ibuprofen, naproxen, etc.) Piroxicam (Feldene) Propafenone (Rythmol SR) Propranolol (Inderal) Isoniazid (INH) Posaconazole (Noxafil) Barbiturates (Phenobarbital) Carbamazepine (Tegretol) Chlordiazepoxide (Librium) Cholestyramine (Questran) Griseofulvin Oral Contraceptives Rifampin Sucralfate (Carafate) Vitamin K   Coumadin (Warfarin) Major Herbal Interactions  Increased Warfarin Effect Decreased Warfarin Effect  Garlic Ginseng Ginkgo biloba Coenzyme Q10 Green tea St. Johns wort    Coumadin (Warfarin) FOOD Interactions  Eat a consistent number of servings per week of foods HIGH in Vitamin K (1 serving =  cup)  Collards (cooked, or boiled & drained) Kale (cooked, or boiled & drained) Mustard greens (cooked, or boiled & drained) Parsley *serving size only =  cup Spinach (cooked, or boiled & drained) Swiss chard (cooked, or boiled & drained) Turnip greens (cooked, or boiled & drained)  Eat a consistent number of servings per week of foods MEDIUM-HIGH in Vitamin K (1 serving = 1 cup)  Asparagus (cooked, or boiled & drained) Broccoli (cooked, boiled & drained, or raw & chopped) Brussel sprouts (cooked, or boiled & drained) *serving size only =  cup Lettuce, raw (green leaf, endive, romaine) Spinach, raw Turnip greens, raw & chopped   These websites have more information on Coumadin (warfarin):  http://www.king-russell.com/; https://www.hines.net/;

## 2015-10-24 NOTE — Progress Notes (Signed)
Orthopedics Progress Note  Subjective: Patient complained of pain overnight  Objective:  Vitals:   10/24/15 0034 10/24/15 0515  BP: (!) 146/87 (!) 144/73  Pulse: 82 94  Resp: 18 18  Temp: 98 F (36.7 C) 98.7 F (37.1 C)    General: Awake and alert  Musculoskeletal: knee dressing intact Neurovascularly intact  Lab Results  Component Value Date   WBC 15.1 (H) 10/24/2015   HGB 14.3 10/24/2015   HCT 40.8 10/24/2015   MCV 84.3 10/24/2015   PLT 204 10/24/2015       Component Value Date/Time   NA 136 10/24/2015 0313   K 4.1 10/24/2015 0313   CL 104 10/24/2015 0313   CO2 26 10/24/2015 0313   GLUCOSE 173 (H) 10/24/2015 0313   BUN 17 10/24/2015 0313   CREATININE 1.00 10/24/2015 0313   CALCIUM 8.8 (L) 10/24/2015 0313   GFRNONAA >60 10/24/2015 0313   GFRAA >60 10/24/2015 0313    Lab Results  Component Value Date   INR 1.17 10/24/2015    Assessment/Plan: POD #1 s/p Procedure(s): TOTAL KNEE ARTHROPLASTY D/C Foley Pain control Patient with hyperglycemia - will place on sliding scale  Viviann SpareSteven R. Ranell PatrickNorris, MD 10/24/2015 7:23 AM
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
## 2015-10-24 NOTE — Evaluation (Signed)
Physical Therapy Evaluation Patient Details Name: Tommy Odom MRN: [id number] DOB: 15-Jun-1960 Today's Date: 10/24/2015   History of Present Illness  55 y.o. male s/p R TKA.  Clinical Impression  Pt is s/p TKA resulting in the deficits listed below (see PT Problem List). Pt's mobility limited today by N&V. On eval, pt required min assist bed mobility, min guard assist transfers, and min guard assist ambulation 130 feet with RW. Pt will benefit from skilled PT to increase their independence and safety with mobility to allow discharge to the venue listed below.      Follow Up Recommendations Outpatient PT;Supervision - Intermittent    Equipment Recommendations  Rolling walker with 5" wheels    Recommendations for Other Services       Precautions / Restrictions Precautions Precautions: Knee Precaution Comments: Educated pt on no pillow under knee. Required Braces or Orthoses: Knee Immobilizer - Right Knee Immobilizer - Right: On when out of bed or walking Restrictions Weight Bearing Restrictions: Yes RLE Weight Bearing: Weight bearing as tolerated      Mobility  Bed Mobility Overal bed mobility: Needs Assistance Bed Mobility: Supine to Sit     Supine to sit: Min assist     General bed mobility comments: +rail, assist with RLE  Transfers Overall transfer level: Needs assistance Equipment used: Rolling walker (2 wheeled) Transfers: Sit to/from [employer] Sit to Stand: Min guard Stand pivot transfers: Min guard       General transfer comment: verbal cues for hand placement  Ambulation/Gait Ambulation/Gait assistance: Min guard Ambulation Distance (Feet): 130 Feet Assistive device: Rolling walker (2 wheeled) Gait Pattern/deviations: Step-to pattern;Antalgic;Decreased stride length Gait velocity: decreased Gait velocity interpretation: Below normal speed for age/gender General Gait Details: heavy reliance on BUE through RW, verbal cues for  sequencing and RW placement  Stairs            Wheelchair Mobility    Modified Rankin (Stroke Patients Only)       Balance                                             Pertinent Vitals/Pain Pain Assessment: 0-10 Pain Score: 3  Pain Location: R knee Pain Descriptors / Indicators: Sore Pain Intervention(s): Monitored during session;Repositioned    Home Living Family/patient expects to be discharged to:: Private residence Living Arrangements: Spouse/significant other Available Help at Discharge: Family;Available 24 hours/day Type of Home: House Home Access: Level entry     Home Layout: One level Home Equipment: Crutches      Prior Function Level of Independence: Independent               Hand Dominance        Extremity/Trunk Assessment                         Communication   Communication: No difficulties  Cognition Arousal/Alertness: Awake/alert Behavior During Therapy: WFL for tasks assessed/performed Overall Cognitive Status: Within Functional Limits for tasks assessed                      General Comments      Exercises Total Joint Exercises Ankle Circles/Pumps: AROM;Both;10 reps Quad Sets: AROM;Right;10 reps Heel Slides: AAROM;Right;5 reps Goniometric ROM: 5-45 R knee   Assessment/Plan    PT Assessment Patient needs continued PT services  PT Problem List Decreased strength;Decreased range of motion;Decreased activity tolerance;Decreased balance;Decreased mobility;Pain;Decreased knowledge of use of DME;Decreased knowledge of precautions          PT Treatment Interventions DME instruction;Gait training;Stair training;Functional mobility training;Balance training;Therapeutic exercise;Therapeutic activities;Patient/family education    PT Goals (Current goals can be found in the Care Plan section)  Acute Rehab PT Goals Patient Stated Goal: home PT Goal Formulation: With patient Time For Goal  Achievement: 10/31/15 Potential to Achieve Goals: Good    Frequency 7X/week   Barriers to discharge        Co-evaluation               End of Session Equipment Utilized During Treatment: Gait belt Activity Tolerance: Patient tolerated treatment well Patient left: in chair;with family/visitor present;with call bell/phone within reach Nurse Communication: Mobility status         Time: [id number] PT Time Calculation (min) (ACUTE ONLY): 23 min   Charges:   PT Evaluation $PT Eval Moderate Complexity: 1 Procedure PT Treatments $Gait Training: 8-22 mins   PT G Codes:        Ilda FoilGarrow, Spiros Greenfeld Rene 10/24/2015, 2:38 PM

## 2015-10-24 NOTE — Progress Notes (Signed)
Orthopedic Tech Progress Note Patient Details:  Geralynn RileHaraldo S Badia 06/16/1960 [id number]  Patient ID: Geralynn RileHaraldo S Elrod, male   DOB: 05/01/1960, 55 y.o.   MRN: [id number] Applied cpm 0-60  Trinna PostMartinez, Miklo Aken J 10/24/2015, 5:50 AM
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
## 2015-10-24 NOTE — Progress Notes (Signed)
PT Cancellation Note  Patient Details Name: Geralynn RileHaraldo S Aydt MRN: [id number] DOB: 06/06/1960   Cancelled Treatment:    Reason Eval/Treat Not Completed: Pain limiting ability to participate. Pt reporting 10/10. Pt's discomfort is visible as he is lying in bed. RN notified. PT to re-attempt later today.   Ilda FoilGarrow, Janneth Krasner Rene 10/24/2015, 8:51 AM

## 2015-10-25 LAB — GLUCOSE, CAPILLARY
GLUCOSE-CAPILLARY: 103 mg/dL — AB (ref 65–99)
GLUCOSE-CAPILLARY: 116 mg/dL — AB (ref 65–99)
Glucose-Capillary: 133 mg/dL — ABNORMAL HIGH (ref 65–99)
Glucose-Capillary: 120 mg/dL — ABNORMAL HIGH (ref 65–99)

## 2015-10-25 LAB — CBC
HEMATOCRIT: 37.8 % — AB (ref 39.0–52.0)
Hemoglobin: 13.1 g/dL (ref 13.0–17.0)
MCH: 29.3 pg (ref 26.0–34.0)
MCHC: 34.7 g/dL (ref 30.0–36.0)
MCV: 84.6 fL (ref 78.0–100.0)
PLATELETS: 186 10*3/uL (ref 150–400)
RBC: 4.47 MIL/uL (ref 4.22–5.81)
RDW: 12.8 % (ref 11.5–15.5)
WBC: 12.4 10*3/uL — ABNORMAL HIGH (ref 4.0–10.5)

## 2015-10-25 LAB — PROTIME-INR
INR: 1.19
Prothrombin Time: 15.2 seconds (ref 11.4–15.2)

## 2015-10-25 MED ORDER — NON FORMULARY: 30.0000 mg | Freq: Every day | Status: DC

## 2015-10-25 MED ORDER — WARFARIN SODIUM 5 MG PO TABS
10.0000 mg | ORAL_TABLET | Freq: Once | ORAL | Status: AC
  Administered 2015-10-25: 10 mg via ORAL
  Filled 2015-10-25: qty 2

## 2015-10-25 MED ORDER — LANSOPRAZOLE 30 MG PO CPDR
30.0000 mg | DELAYED_RELEASE_CAPSULE | Freq: Every day | ORAL | Status: DC
  Filled 2015-10-25 (×2): qty 1
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
## 2015-10-25 NOTE — Evaluation (Signed)
Occupational Therapy Evaluation Patient Details Name: Tommy Odom MRN: [id number] DOB: 25-Nov-1960 Today's Date: 10/25/2015    History of Present Illness 55 y.o. male s/p R TKA.   Clinical Impression   Pt. Is doing well but was limited by dizziness and did not want to transfer OOB. Pt. Wants to work on toilet and tub transfers to increase I at home. Pt. States he will need DME. Pt. Wife will assist with LE ADLs.    Follow Up Recommendations  Home health OT    Equipment Recommendations  3 in 1 bedside comode;Tub/shower seat    Recommendations for Other Services       Precautions / Restrictions Precautions Precautions: Knee Required Braces or Orthoses: Knee Immobilizer - Right Knee Immobilizer - Right: On when out of bed or walking Restrictions Weight Bearing Restrictions: Yes RLE Weight Bearing: Weight bearing as tolerated      Mobility Bed Mobility Overal bed mobility: Needs Assistance Bed Mobility: Supine to Sit;Sit to Supine     Supine to sit: Min assist Sit to supine: Min assist      Transfers                      Balance                                            ADL Overall ADL's : Needs assistance/impaired Eating/Feeding: Independent   Grooming: Set up   Upper Body Bathing: Set up   Lower Body Bathing: Moderate assistance   Upper Body Dressing : Set up   Lower Body Dressing: Moderate assistance               Functional mobility during ADLs: Minimal assistance General ADL Comments: Pt. was feeling dizzy and did not want to tranfser out of bed. Pt. was able to perform sit to stand from bed and side step to head of bed. Pt. wife will assist with LE ADLs. Pt. wants to focus on tub and toilet transfers.      Vision     Perception     Praxis      Pertinent Vitals/Pain Pain Assessment: 0-10 Pain Score: 0-No pain     Hand Dominance     Extremity/Trunk Assessment Upper Extremity Assessment Upper  Extremity Assessment: Overall WFL for tasks assessed           Communication Communication Communication: No difficulties   Cognition Arousal/Alertness: Awake/alert Behavior During Therapy: WFL for tasks assessed/performed Overall Cognitive Status: Within Functional Limits for tasks assessed                     General Comments       Exercises       Shoulder Instructions      Home Living Family/patient expects to be discharged to:: Private residence Living Arrangements: Spouse/significant other Available Help at Discharge: Family;Available 24 hours/day Type of Home: House Home Access: Level entry     Home Layout: One level     Bathroom Shower/Tub: Tub/shower unit;Curtain Shower/tub characteristics: Engineer, building services: Standard     Home Equipment: Crutches          Prior Functioning/Environment Level of Independence: Independent                 OT Problem List: Decreased activity tolerance;Decreased knowledge of use of DME or  AE   OT Treatment/Interventions: Self-care/ADL training;DME and/or AE instruction    OT Goals(Current goals can be found in the care plan section) Acute Rehab OT Goals Patient Stated Goal:  (To go home and take a shower.) OT Goal Formulation: With patient/family Time For Goal Achievement: 11/08/15 Potential to Achieve Goals: Good  OT Frequency: Min 2X/week   Barriers to D/C:            Co-evaluation              End of Session Equipment Utilized During Treatment: Rolling walker  Activity Tolerance: Other (comment) Patient left:  (Pt. limited by dizziness)   Time: [id number] OT Time Calculation (min): 48 min Charges:  OT General Charges $OT Visit: 1 Procedure OT Evaluation $OT Eval Moderate Complexity: 1 Procedure OT Treatments $Self Care/Home Management : 23-37 mins G-Codes:    Stedman [place] 10/25/2015, 8:04 AM

## 2015-10-25 NOTE — Progress Notes (Signed)
Orthopedic Tech Progress Note Patient Details:  Tommy RileHaraldo S Odom 11/25/1960 [id number]  Patient ID: Tommy RileHaraldo S Odom, male   DOB: 05/12/1960, 55 y.o.   MRN: [id number]   Tommy Odom, Tommy Odom 10/25/2015, 1:31 PM Placed pt's rle on cpm @0 -60 degrees @1330 ; RN notified

## 2015-10-25 NOTE — Progress Notes (Signed)
Physical Therapy Treatment Patient Details Name: Tommy Odom MRN: [id number] DOB: 1960/08/26 Today's Date: 10/25/2015    History of Present Illness 55 y.o. male s/p R TKA.    PT Comments    Patient tolerated increased gait distance this session. Given HEP handout. Continue to progress as tolerated.   Follow Up Recommendations  Outpatient PT;Supervision - Intermittent     Equipment Recommendations  Rolling walker with 5" wheels    Recommendations for Other Services       Precautions / Restrictions Precautions Precautions: Knee Precaution Comments: reviewed precuations and positioning Required Braces or Orthoses: Knee Immobilizer - Right Knee Immobilizer - Right: On when out of bed or walking Restrictions Weight Bearing Restrictions: Yes RLE Weight Bearing: Weight bearing as tolerated    Mobility  Bed Mobility Overal bed mobility: Needs Assistance Bed Mobility: Supine to Sit     Supine to sit: Min guard     General bed mobility comments: cues for sequencing and technique  Transfers Overall transfer level: Needs assistance Equipment used: Rolling walker (2 wheeled) Transfers: Sit to/from Stand Sit to Stand: Supervision         General transfer comment: safe hand placement and technique  Ambulation/Gait Ambulation/Gait assistance: Supervision Ambulation Distance (Feet): 210 Feet Assistive device: Rolling walker (2 wheeled) Gait Pattern/deviations: Step-through pattern;Decreased stance time - right;Decreased step length - left;Decreased weight shift to right;Antalgic Gait velocity: decreased   General Gait Details: pt with improved gait mechanics with increased distance; cues for posture, sequencing, and R heel strike   Stairs            Wheelchair Mobility    Modified Rankin (Stroke Patients Only)       Balance                                    Cognition Arousal/Alertness: Awake/alert Behavior During Therapy: WFL for  tasks assessed/performed Overall Cognitive Status: Within Functional Limits for tasks assessed                      Exercises      General Comments General comments (skin integrity, edema, etc.): pt declined therex; given HEP handout and encouraged to do after dinner      Pertinent Vitals/Pain Pain Assessment: Faces Faces Pain Scale: Hurts a little bit Pain Location: R knee Pain Descriptors / Indicators: Sore;Guarding Pain Intervention(s): Limited activity within patient's tolerance;Monitored during session;Repositioned    Home Living                      Prior Function            PT Goals (current goals can now be found in the care plan section) Acute Rehab PT Goals Patient Stated Goal: home Progress towards PT goals: Progressing toward goals    Frequency    7X/week      PT Plan Current plan remains appropriate    Co-evaluation             End of Session Equipment Utilized During Treatment: Gait belt Activity Tolerance: Patient tolerated treatment well Patient left: in chair;with family/visitor present;with call bell/phone within reach     Time: 4098-1191 PT Time Calculation (min) (ACUTE ONLY): 22 min  Charges:  $Gait Training: 8-22 mins                    G Codes:  Derek MoundKellyn R Mavrik Bynum Tayte Childers, PTA Pager: [phone]   10/25/2015, 5:12 PM

## 2015-10-25 NOTE — Progress Notes (Signed)
Physical Therapy Treatment Patient Details Name: Tommy Odom MRN: [id number] DOB: [birth year] Today's Date: 10/25/2015    History of Present Illness 55 y.o. male s/p R TKA.    PT Comments    Patient is progressing well toward mobility goals and tolerated therex and increased gait distance this session. Current plan remains appropriate.   Follow Up Recommendations  Outpatient PT;Supervision - Intermittent     Equipment Recommendations  Rolling walker with 5" wheels    Recommendations for Other Services       Precautions / Restrictions Precautions Precautions: Knee Precaution Comments: reviewed precuations and positioning Required Braces or Orthoses: Knee Immobilizer - Right Knee Immobilizer - Right: On when out of bed or walking Restrictions Weight Bearing Restrictions: Yes RLE Weight Bearing: Weight bearing as tolerated    Mobility  Bed Mobility Overal bed mobility: Needs Assistance Bed Mobility: Supine to Sit     Supine to sit: Min guard     General bed mobility comments: cues for sequencing and technique  Transfers Overall transfer level: Needs assistance Equipment used: Rolling walker (2 wheeled) Transfers: Sit to/from Stand Sit to Stand: Supervision         General transfer comment: safe hand placement and technique  Ambulation/Gait Ambulation/Gait assistance: Supervision Ambulation Distance (Feet): 180 Feet Assistive device: Rolling walker (2 wheeled) Gait Pattern/deviations: Step-through pattern;Decreased stance time - right;Decreased step length - left;Decreased weight shift to right Gait velocity: decreased   General Gait Details: cues for posture and increased WB on R LE; pt with heavy reliance on RW    Stairs            Wheelchair Mobility    Modified Rankin (Stroke Patients Only)       Balance                                    Cognition Arousal/Alertness: Awake/alert Behavior During Therapy: WFL for  tasks assessed/performed Overall Cognitive Status: Within Functional Limits for tasks assessed                      Exercises Total Joint Exercises Quad Sets: AROM;Right;10 reps;Supine Heel Slides: AROM;AAROM;Right;10 reps;Supine Hip ABduction/ADduction: AROM;Right;10 reps;Supine Straight Leg Raises: AAROM;Right;5 reps;Supine Knee Flexion: AROM;Right;5 reps;Seated Goniometric ROM: 5-60    General Comments        Pertinent Vitals/Pain Pain Assessment: Faces Faces Pain Scale: Hurts little more Pain Location: R Knee Pain Descriptors / Indicators: Sore Pain Intervention(s): Limited activity within patient's tolerance;Monitored during session;Repositioned;Premedicated before session    Home Living                      Prior Function            PT Goals (current goals can now be found in the care plan section) Acute Rehab PT Goals Patient Stated Goal: home Progress towards PT goals: Progressing toward goals    Frequency    7X/week      PT Plan Current plan remains appropriate    Co-evaluation             End of Session Equipment Utilized During Treatment: Gait belt Activity Tolerance: Patient tolerated treatment well Patient left: in chair;with family/visitor present;with call bell/phone within reach     Time: 4098-1191 PT Time Calculation (min) (ACUTE ONLY): 30 min  Charges:  $Gait Training: 8-22 mins $Therapeutic Exercise: 8-22 mins  G Codes:      Derek MoundKellyn R Akiva Josey Felix Meras, PTA Pager: [phone]   10/25/2015, 1:09 PM

## 2015-10-25 NOTE — Progress Notes (Signed)
Subjective: 2 Days Post-Op Procedure(s) (LRB): TOTAL KNEE ARTHROPLASTY (Right) Patient reports pain as mild to right knee.  Tolerating PO's. He does report heart burn. HE usually takes prevacid and states the Protonix is not working. Dies SOB, CO, or calf pain. Progressing with PT.  Objective: Vital signs in last 24 hours: Temp:  [98.5 F (36.9 C)-98.9 F (37.2 C)] 98.7 F (37.1 C) (09/17 0431) Pulse Rate:  [81-86] 81 (09/17 0431) Resp:  [18] 18 (09/17 0431) BP: (132-152)/(82-97) 132/92 (09/17 0431) SpO2:  [90 %-94 %] 90 % (09/17 0431)  Intake/Output from previous day: 09/16 0701 - 09/17 0700 In: 360 [P.O.:360] Out: 750 [Urine:750] Intake/Output this shift: No intake/output data recorded.   Recent Labs  10/23/15 1610 10/24/15 0313 10/25/15 0333  HGB 15.2 14.3 13.1    Recent Labs  10/24/15 0313 10/25/15 0333  WBC 15.1* 12.4*  RBC 4.84 4.47  HCT 40.8 37.8*  PLT 204 186    Recent Labs  10/23/15 1610 10/24/15 0313  NA  --  136  K  --  4.1  CL  --  104  CO2  --  26  BUN  --  17  CREATININE 1.00 1.00  GLUCOSE  --  173*  CALCIUM  --  8.8*    Recent Labs  10/24/15 0313 10/25/15 0333  INR 1.17 1.19    Well nourished. Alert and oriented x3. RRR, Lungs clear, BS x4. Abdomen soft and non tender. Right Calf soft and non tender. Right knee dressing C/D/I. No DVT signs. Compartment soft. No signs of infection.  Right LE grossly neurovascular intact.  Assessment/Plan: 2 Days Post-Op Procedure(s) (LRB): TOTAL KNEE ARTHROPLASTY (Right) Dressing changed Up with PT Start Prevacid, failed Protonix Plan D/c tomorrow  Arsenio LoaderSTILWELL, Rut Betterton L 10/25/2015, 9:01 AM

## 2015-10-26 ENCOUNTER — Encounter (HOSPITAL_COMMUNITY): Payer: Self-pay | Admitting: General Practice

## 2015-10-26 LAB — CBC
HCT: 35.8 % — ABNORMAL LOW (ref 39.0–52.0)
Hemoglobin: 12.8 g/dL — ABNORMAL LOW (ref 13.0–17.0)
MCH: 30.3 pg (ref 26.0–34.0)
MCHC: 35.8 g/dL (ref 30.0–36.0)
MCV: 84.6 fL (ref 78.0–100.0)
PLATELETS: 182 10*3/uL (ref 150–400)
RBC: 4.23 MIL/uL (ref 4.22–5.81)
RDW: 12.4 % (ref 11.5–15.5)
WBC: 12.1 10*3/uL — ABNORMAL HIGH (ref 4.0–10.5)

## 2015-10-26 LAB — GLUCOSE, CAPILLARY
GLUCOSE-CAPILLARY: 126 mg/dL — AB (ref 65–99)
Glucose-Capillary: 120 mg/dL — ABNORMAL HIGH (ref 65–99)

## 2015-10-26 LAB — PROTIME-INR
INR: 2.12
INR: 2.71
PROTHROMBIN TIME: 24.1 s — AB (ref 11.4–15.2)
PROTHROMBIN TIME: 29.3 s — AB (ref 11.4–15.2)

## 2015-10-26 MED ORDER — PANTOPRAZOLE SODIUM 40 MG PO TBEC
40.0000 mg | DELAYED_RELEASE_TABLET | Freq: Every day | ORAL | Status: DC
  Administered 2015-10-26: 40 mg via ORAL
  Filled 2015-10-26: qty 1

## 2015-10-26 NOTE — Progress Notes (Signed)
Pt provided discharge teaching and prescriptions, verbalized understanding. Discharge education handout printed in spanish. Pt wheeled out by Nurse tech with home equipment. Discharge to home with wife.

## 2015-10-26 NOTE — Progress Notes (Signed)
   Subjective: 3 Days Post-Op Procedure(s) (LRB): TOTAL KNEE ARTHROPLASTY (Right)  Pt doing fairly well Mild to moderate pain in the right knee Working well with therapy Denies any new issues or symptoms Patient reports pain as moderate.  Objective:   VITALS:   Vitals:   10/25/15 2015 10/26/15 0448  BP: 130/80 (!) 141/76  Pulse: 93 100  Resp: 18 16  Temp: 99.1 F (37.3 C) 99.3 F (37.4 C)    Right knee incision healing well nv intact distally No rashes or edema  LABS  Recent Labs  10/24/15 0313 10/25/15 0333 10/26/15 0255  HGB 14.3 13.1 12.8*  HCT 40.8 37.8* 35.8*  WBC 15.1* 12.4* 12.1*  PLT 204 186 182     Recent Labs  10/23/15 1610 10/24/15 0313  NA  --  136  K  --  4.1  BUN  --  17  CREATININE 1.00 1.00  GLUCOSE  --  173*     Assessment/Plan: 3 Days Post-Op Procedure(s) (LRB): TOTAL KNEE ARTHROPLASTY (Right) D/c home today F/u in  2 weeks Continue with daily therapy exercises    Alphonsa OverallBrad Yoandri Congrove, MPAS, PA-C  10/26/2015, 9:36 AM

## 2015-10-26 NOTE — Care Management Note (Signed)
Case Management Note  Patient Details  Name: Tommy Odom MRN: [id number] Date of Birth: 11/23/1960  Subjective/Objective:  55 yr old gentleman s/p right total knee arthroplasty.                  Action/Plan: Case manager spoke with patient's adjuster, Joey concerning getting DME arranged. Orders for DME were faxed to him at [phone]. DME has been authorized through [facility], will be delivered to patient's room.   Expected Discharge Date:   10/26/15               Expected Discharge Plan:  Home w Home Health Services  In-House Referral:  NA  Discharge planning Services  CM Consult  Post Acute Care Choice:  Home Health Choice offered to:     DME Arranged:  3-N-1, Walker rolling, CPM DME Agency:   [facility]  HH Arranged:  PT HH Agency:     Status of Service:  completed If discussed at Long Length of Stay Meetings, dates discussed:    Additional Comments:  Durenda GuthrieBrady, Jeremyah Jelley Naomi, RN 10/26/2015, 3:18 PM

## 2015-10-26 NOTE — Care Management (Signed)
Patient will go to outpatient therapy. CPM to be delivered to patient's home by [facility]

## 2015-10-26 NOTE — Progress Notes (Signed)
ANTICOAGULATION CONSULT NOTE -Follow-Up Consult  Pharmacy Consult for warfarin Indication: VTE prophylaxis  Allergies  Allergen Reactions  . No Known Allergies    Patient Measurements: Height: 5\' 8"  (172.7 cm) Weight: 223 lb 1.6 oz (101.2 kg) IBW/kg (Calculated) : 68.4  Vital Signs: Temp: 99.3 F (37.4 C) (09/18 0448) Temp Source: Oral (09/18 0448) BP: 141/76 (09/18 0448) Pulse Rate: 100 (09/18 0448)  Labs:  Recent Labs  10/23/15 1610  10/24/15 0313 10/25/15 0333 10/26/15 0255 10/26/15 1102  HGB 15.2  --  14.3 13.1 12.8*  --   HCT 43.1  --  40.8 37.8* 35.8*  --   PLT 192  --  204 186 182  --   LABPROT  --   < > 15.0 15.2 24.1* 29.3*  INR  --   < > 1.17 1.19 2.12 2.71  CREATININE 1.00  --  1.00  --   --   --   < > = values in this interval not displayed.  Estimated Creatinine Clearance: 98.5 mL/min (by C-G formula based on SCr of 1 mg/dL).  Assessment: [name] is a [age] yom with recent orthopedic surgery on his right knee, on coumadin for VTE prophylaxis. Big INR increase today after 2 doses of 7.5mg  daily followed by 10 mg x 1 yesterday. Pt is also on sq lovenox. Plan for discharge home today on coumadin 5mg  daily.    Goal of Therapy:  INR 2-3 Monitor platelets by anticoagulation protocol: Yes   Plan:  Hold coumadin today, RN will tell pt not to take coumadin tonight if goes home. D/C lovenox Daily INR, CBC Monitor s/sx of bleeding If goes home, 5mg  daily should be appropriate. But recommend outpatient INR follow up Tuesday or Wednesday.  Bayard HuggerMei Griff Badley, PharmD, BCPS  Clinical Pharmacist  Pager: [phone]  10/24/15 1:18 PM

## 2015-10-26 NOTE — Progress Notes (Signed)
Occupational Therapy Treatment Patient Details Name: Tommy Odom MRN: [id number] DOB: 09/10/1960 Today's Date: 10/26/2015    History of present illness 55 y.o. male s/p R TKA.   OT comments  Pt eager to go home, awaiting equipment. Pt performing ADL at a supervision level. Declined practicing tub transfer, but verbally instructed in technique and walker placement. Wife will supervise transfers.   Follow Up Recommendations  No OT follow up    Equipment Recommendations  3 in 1 bedside comode;Tub/shower seat    Recommendations for Other Services      Precautions / Restrictions Precautions Precautions: Knee Restrictions Weight Bearing Restrictions: No RLE Weight Bearing: Weight bearing as tolerated Other Position/Activity Restrictions: PA discontinued KI per request of PT via verbal order this AM.       Mobility Bed Mobility Overal bed mobility: Modified Independent       Supine to sit: Min guard;Supervision        Transfers Overall transfer level: Needs assistance Equipment used: Rolling walker (2 wheeled) Transfers: Sit to/from Stand Sit to Stand: Supervision         General transfer comment: Supervision for safety. Safe hand placement/technique.    Balance             Standing balance-Leahy Scale: Fair                     ADL Overall ADL's : Needs assistance/impaired     Grooming: Wash/dry hands;Standing;Supervision/safety               Lower Body Dressing: Supervision/safety;Sit to/from stand   Toilet Transfer: Supervision/safety;RW;Ambulation   Toileting- ArchitectClothing Manipulation and Hygiene: Supervision/safety;Sit to/from Nurse, children'sstand     Tub/Shower Transfer Details (indicate cue type and reason): instructed in technique, pt declined practicing Functional mobility during ADLs: Supervision/safety;Rolling walker General ADL Comments: Pt able to access R LE for bathing and dressing.      Vision                      Perception     Praxis      Cognition   Behavior During Therapy: WFL for tasks assessed/performed Overall Cognitive Status: Within Functional Limits for tasks assessed                       Extremity/Trunk Assessment               Exercises     Shoulder Instructions       General Comments      Pertinent Vitals/ Pain       Pain Assessment: 0-10 Faces Pain Scale: Hurts little more Pain Location: R knee Pain Descriptors / Indicators: Sore Pain Intervention(s): Monitored during session;Repositioned;Ice applied  Home Living                                          Prior Functioning/Environment              Frequency  Min 2X/week        Progress Toward Goals  OT Goals(current goals can now be found in the care plan section)  Progress towards OT goals: Progressing toward goals  Acute Rehab OT Goals Patient Stated Goal: home  Plan Discharge plan needs to be updated    Co-evaluation  End of Session Equipment Utilized During Treatment: Rolling walker   Activity Tolerance     Patient Left in bed;with call bell/phone within reach;with family/visitor present   Nurse Communication          Time: 1400-1420 OT Time Calculation (min): 20 min  Charges: OT General Charges $OT Visit: 1 Procedure OT Treatments $Self Care/Home Management : 8-22 mins  Evern Bio 10/26/2015, 4:30 PM  [phone]

## 2015-10-26 NOTE — Discharge Summary (Signed)
Physician Discharge Summary   Patient ID: Tommy Odom MRN: [id number] DOB/AGE: 1960/05/18 55 y.o.  Admit date: 10/23/2015 Discharge date: 10/26/2015  Admission Diagnoses:  Active Problems:   H/O total knee replacement   Discharge Diagnoses:  Same   Surgeries: Procedure(s): TOTAL KNEE ARTHROPLASTY on 10/23/2015   Consultants: PT/OT  Discharged Condition: Stable  Hospital Course: Tommy Odom is an 55 y.o. male who was admitted 10/23/2015 with a chief complaint of right knee pain, and found to have a diagnosis of end stage osteoarthritis.  They were brought to the operating room on 10/23/2015 and underwent the above named procedures.    The patient had an uncomplicated hospital course and was stable for discharge.  Recent vital signs:  Vitals:   10/25/15 2015 10/26/15 0448  BP: 130/80 (!) 141/76  Pulse: 93 100  Resp: 18 16  Temp: 99.1 F (37.3 C) 99.3 F (37.4 C)    Recent laboratory studies:  Results for orders placed or performed during the hospital encounter of 10/23/15  CBC  Result Value Ref Range   WBC 17.1 (H) 4.0 - 10.5 K/uL   RBC 5.11 4.22 - 5.81 MIL/uL   Hemoglobin 15.2 13.0 - 17.0 g/dL   HCT 11.9 14.7 - 82.9 %   MCV 84.3 78.0 - 100.0 fL   MCH 29.7 26.0 - 34.0 pg   MCHC 35.3 30.0 - 36.0 g/dL   RDW 56.2 13.0 - 86.5 %   Platelets 192 150 - 400 K/uL  Creatinine, serum  Result Value Ref Range   Creatinine, Ser 1.00 0.61 - 1.24 mg/dL   GFR calc non Af Amer >60 >60 mL/min   GFR calc Af Amer >60 >60 mL/min  CBC  Result Value Ref Range   WBC 15.1 (H) 4.0 - 10.5 K/uL   RBC 4.84 4.22 - 5.81 MIL/uL   Hemoglobin 14.3 13.0 - 17.0 g/dL   HCT 78.4 69.6 - 29.5 %   MCV 84.3 78.0 - 100.0 fL   MCH 29.5 26.0 - 34.0 pg   MCHC 35.0 30.0 - 36.0 g/dL   RDW 28.4 13.2 - 44.0 %   Platelets 204 150 - 400 K/uL  Basic metabolic panel  Result Value Ref Range   Sodium 136 135 - 145 mmol/L   Potassium 4.1 3.5 - 5.1 mmol/L   Chloride 104 101 - 111 mmol/L   CO2 26  22 - 32 mmol/L   Glucose, Bld 173 (H) 65 - 99 mg/dL   BUN 17 6 - 20 mg/dL   Creatinine, Ser 1.02 0.61 - 1.24 mg/dL   Calcium 8.8 (L) 8.9 - 10.3 mg/dL   GFR calc non Af Amer >60 >60 mL/min   GFR calc Af Amer >60 >60 mL/min   Anion gap 6 5 - 15  Protime-INR  Result Value Ref Range   Prothrombin Time 15.0 11.4 - 15.2 seconds   INR 1.17   Glucose, capillary  Result Value Ref Range   Glucose-Capillary 149 (H) 65 - 99 mg/dL  Glucose, capillary  Result Value Ref Range   Glucose-Capillary 139 (H) 65 - 99 mg/dL   Comment 1 Notify RN   Glucose, capillary  Result Value Ref Range   Glucose-Capillary 133 (H) 65 - 99 mg/dL   Comment 1 Notify RN   CBC  Result Value Ref Range   WBC 12.4 (H) 4.0 - 10.5 K/uL   RBC 4.47 4.22 - 5.81 MIL/uL   Hemoglobin 13.1 13.0 - 17.0 g/dL   HCT 72.5 (L)  39.0 - 52.0 %   MCV 84.6 78.0 - 100.0 fL   MCH 29.3 26.0 - 34.0 pg   MCHC 34.7 30.0 - 36.0 g/dL   RDW 45.412.8 09.811.5 - 11.915.5 %   Platelets 186 150 - 400 K/uL  Protime-INR  Result Value Ref Range   Prothrombin Time 15.2 11.4 - 15.2 seconds   INR 1.19   Glucose, capillary  Result Value Ref Range   Glucose-Capillary 129 (H) 65 - 99 mg/dL  Glucose, capillary  Result Value Ref Range   Glucose-Capillary 133 (H) 65 - 99 mg/dL  Glucose, capillary  Result Value Ref Range   Glucose-Capillary 120 (H) 65 - 99 mg/dL   Comment 1 Notify RN   Glucose, capillary  Result Value Ref Range   Glucose-Capillary 103 (H) 65 - 99 mg/dL   Comment 1 Notify RN   CBC  Result Value Ref Range   WBC 12.1 (H) 4.0 - 10.5 K/uL   RBC 4.23 4.22 - 5.81 MIL/uL   Hemoglobin 12.8 (L) 13.0 - 17.0 g/dL   HCT 14.735.8 (L) 82.939.0 - 56.252.0 %   MCV 84.6 78.0 - 100.0 fL   MCH 30.3 26.0 - 34.0 pg   MCHC 35.8 30.0 - 36.0 g/dL   RDW 13.012.4 86.511.5 - 78.415.5 %   Platelets 182 150 - 400 K/uL  Protime-INR  Result Value Ref Range   Prothrombin Time 24.1 (H) 11.4 - 15.2 seconds   INR 2.12   Glucose, capillary  Result Value Ref Range   Glucose-Capillary 116 (H)  65 - 99 mg/dL  Glucose, capillary  Result Value Ref Range   Glucose-Capillary 120 (H) 65 - 99 mg/dL    Discharge Medications:  Coumadin 5 mg Robaxin 500 mg Percocet 7.5/325 lovenox   Current Outpatient Prescriptions  Medication Sig Dispense Refill  . ibuprofen (ADVIL,MOTRIN) 800 MG tablet Take 1 tablet (800 mg total) by mouth 3 (three) times daily. 21 tablet 0  . lansoprazole (PREVACID) 30 MG capsule Take 30 mg by mouth daily at 12 noon.    . ondansetron (ZOFRAN ODT) 4 MG disintegrating tablet 4mg  ODT q4 hours prn nausea/vomit 15 tablet 0  . oxyCODONE-acetaminophen (PERCOCET/ROXICET) 5-325 MG tablet Take 1-2 tablets by mouth every 8 (eight) hours as needed for severe pain. 30 tablet 0  . tamsulosin (FLOMAX) 0.4 MG CAPS capsule Take 1 capsule (0.4 mg total) by mouth daily. Until stone has passed.       Diagnostic Studies: Dg Knee Right Port  Result Date: 10/23/2015 CLINICAL DATA:  Postop day 0 right total knee arthroplasty. EXAM: PORTABLE RIGHT KNEE - 1-2 VIEW COMPARISON:  None. FINDINGS: Anatomic alignment post right total knee arthroplasty. No acute complicating features. IMPRESSION: Anatomic lumbar post right total knee arthroplasty without acute complicating features. Electronically Signed   By: Hulan Saashomas  Lawrence M.D.   On: 10/23/2015 14:12    Disposition: 01-Home or Self Care    Follow-up Information    NORRIS,STEVEN R, MD. Call in 2 weeks.   Specialty:  Orthopedic Surgery Why:  [phone] Contact information: [address] [place] [address] [phone]            Signed: Thea GistDIXON,Florette Thai B 10/26/2015, 9:37 AM

## 2015-10-26 NOTE — Progress Notes (Signed)
Physical Therapy Treatment Patient Details Name: Tommy Odom MRN: [id number] DOB: [birth year] Today's Date: 10/26/2015    History of Present Illness 55 y.o. male s/p R TKA.    PT Comments    Patient progressing well towards PT goals. Tolerated gait training without use of KI today with cues for more normalized gait pattern. Pt with loss of knee extension AROM during measurements today so stressed importance of placing towel roll under heel to improve knee extension. Reviewed exercises. Pt safe to discharge home with support of spouse. Will follow if still in hospital tomorrow.   Follow Up Recommendations  Outpatient PT;Supervision - Intermittent     Equipment Recommendations  Rolling walker with 5" wheels    Recommendations for Other Services       Precautions / Restrictions Precautions Precautions: Knee Precaution Booklet Issued: No Precaution Comments: reviewed precautions and positioning Required Braces or Orthoses: Knee Immobilizer - Right Knee Immobilizer - Right: On when out of bed or walking Restrictions Weight Bearing Restrictions: Yes RLE Weight Bearing: Weight bearing as tolerated Other Position/Activity Restrictions: PA discontinued KI per request of PT via verbal order this AM.    Mobility  Bed Mobility Overal bed mobility: Needs Assistance Bed Mobility: Sit to Supine       Sit to supine: Modified independent (Device/Increase time);HOB elevated   General bed mobility comments: Able to use LLE to bring RLE into bed without assist.   Transfers Overall transfer level: Needs assistance Equipment used: Rolling walker (2 wheeled) Transfers: Sit to/from Stand Sit to Stand: Supervision         General transfer comment: Supervision for safety. Safe hand placement/technique.  Ambulation/Gait Ambulation/Gait assistance: Supervision Ambulation Distance (Feet): 210 Feet Assistive device: Rolling walker (2 wheeled) Gait Pattern/deviations: Step-through  pattern;Decreased stance time - right;Decreased step length - left Gait velocity: decreased Gait velocity interpretation: Below normal speed for age/gender General Gait Details: Cues for knee extension during stance phase to activate quads and for knee flexion during swing phase for more normalized gait pattern now that pt does not have KI donned.   Stairs            Wheelchair Mobility    Modified Rankin (Stroke Patients Only)       Balance Overall balance assessment: Needs assistance Sitting-balance support: Feet supported;No upper extremity supported Sitting balance-Leahy Scale: Good     Standing balance support: During functional activity Standing balance-Leahy Scale: Fair Standing balance comment: Able to stand at sink and wash hands without UE support.                    Cognition Arousal/Alertness: Awake/alert Behavior During Therapy: WFL for tasks assessed/performed Overall Cognitive Status: Within Functional Limits for tasks assessed                      Exercises Total Joint Exercises Ankle Circles/Pumps: Both;10 reps;Supine Quad Sets: Both;10 reps;Supine Heel Slides: Right;5 reps;Seated Goniometric ROM: 15-60 degrees knee AROM    General Comments General comments (skin integrity, edema, etc.): Spouse present in room during session.      Pertinent Vitals/Pain Pain Assessment: 0-10 Pain Score: 5  Pain Location: right knee Pain Descriptors / Indicators: Sore Pain Intervention(s): Monitored during session;Repositioned;Ice applied    Home Living                      Prior Function            PT Goals (current  goals can now be found in the care plan section) Progress towards PT goals: Progressing toward goals    Frequency    7X/week      PT Plan Current plan remains appropriate    Co-evaluation             End of Session Equipment Utilized During Treatment: Gait belt Activity Tolerance: Patient tolerated  treatment well Patient left: in bed;with call bell/phone within reach;with family/visitor present     Time: [id number] PT Time Calculation (min) (ACUTE ONLY): 23 min  Charges:  $Gait Training: 8-22 mins $Therapeutic Activity: 8-22 mins                    G Codes:      Tommy Odom Tommy Odom 10/26/2015, 10:37 AM  Tommy Odom, PT, DPT [phone]

## 2015-11-05 ENCOUNTER — Other Ambulatory Visit (HOSPITAL_COMMUNITY)
Admission: RE | Admit: 2015-11-05 | Discharge: 2015-11-05 | Disposition: A | Discharge status: Home / Self Care | Payer: BLUE CROSS/BLUE SHIELD | Source: Ambulatory Visit | Attending: Orthopedic Surgery | Admitting: Orthopedic Surgery

## 2015-11-05 DIAGNOSIS — M25561 Pain in right knee: Secondary | ICD-10-CM | POA: Diagnosis not present

## 2015-11-05 LAB — APTT: APTT: 129 s — AB (ref 24–36)

## 2015-11-05 LAB — CBC
HEMATOCRIT: 30.4 % — AB (ref 39.0–52.0)
HEMOGLOBIN: 10.1 g/dL — AB (ref 13.0–17.0)
MCH: 28.9 pg (ref 26.0–34.0)
MCHC: 33.2 g/dL (ref 30.0–36.0)
MCV: 86.9 fL (ref 78.0–100.0)
Platelets: 523 10*3/uL — ABNORMAL HIGH (ref 150–400)
RBC: 3.5 MIL/uL — AB (ref 4.22–5.81)
RDW: 13.2 % (ref 11.5–15.5)
WBC: 11.1 10*3/uL — AB (ref 4.0–10.5)

## 2015-11-05 LAB — PROTIME-INR
INR: 4.58
Prothrombin Time: 44.6 seconds — ABNORMAL HIGH (ref 11.4–15.2)
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
## 2015-11-12 ENCOUNTER — Encounter (HOSPITAL_COMMUNITY): Payer: Self-pay | Admitting: *Deleted

## 2015-11-12 NOTE — Progress Notes (Signed)
Pt denies SOB, chest pain, and being under the care of a cardiologist. Pt denies having a stress test, echo and cardiac cath. Pt denies having an EKG and chest x ray in the last year. Pt made aware to stop taking Aspirin, vitamins, fish oil and herbal medications. Do not take any NSAIDs ie: Ibuprofen, Advil, Naproxen, BC and Goody Powder or any medication containing Aspirin. Pt verbalized understanding of all pre-op instructions.

## 2015-11-13 ENCOUNTER — Inpatient Hospital Stay (HOSPITAL_COMMUNITY): Payer: Worker's Compensation | Admitting: Anesthesiology

## 2015-11-13 ENCOUNTER — Inpatient Hospital Stay (HOSPITAL_COMMUNITY)
Admission: RE | Admit: 2015-11-13 | Discharge: 2015-11-16 | DRG: 470 | Disposition: A | Discharge status: Home Health | Payer: Worker's Compensation | Source: Ambulatory Visit | Attending: Orthopedic Surgery | Admitting: Orthopedic Surgery

## 2015-11-13 ENCOUNTER — Encounter (HOSPITAL_COMMUNITY): Payer: Self-pay | Admitting: *Deleted

## 2015-11-13 ENCOUNTER — Encounter (HOSPITAL_COMMUNITY)
Admission: RE | Disposition: A | Discharge status: Home Health | Payer: Self-pay | Source: Ambulatory Visit | Attending: Orthopedic Surgery

## 2015-11-13 DIAGNOSIS — Z79899 Other long term (current) drug therapy: Secondary | ICD-10-CM

## 2015-11-13 DIAGNOSIS — Z86010 Personal history of colonic polyps: Secondary | ICD-10-CM

## 2015-11-13 DIAGNOSIS — R791 Abnormal coagulation profile: Secondary | ICD-10-CM | POA: Diagnosis present

## 2015-11-13 DIAGNOSIS — Z87891 Personal history of nicotine dependence: Secondary | ICD-10-CM | POA: Diagnosis not present

## 2015-11-13 DIAGNOSIS — K219 Gastro-esophageal reflux disease without esophagitis: Secondary | ICD-10-CM | POA: Diagnosis present

## 2015-11-13 DIAGNOSIS — M25561 Pain in right knee: Secondary | ICD-10-CM

## 2015-11-13 DIAGNOSIS — Z87442 Personal history of urinary calculi: Secondary | ICD-10-CM

## 2015-11-13 DIAGNOSIS — M1711 Unilateral primary osteoarthritis, right knee: Secondary | ICD-10-CM | POA: Diagnosis present

## 2015-11-13 DIAGNOSIS — Z7901 Long term (current) use of anticoagulants: Secondary | ICD-10-CM

## 2015-11-13 DIAGNOSIS — R269 Unspecified abnormalities of gait and mobility: Secondary | ICD-10-CM

## 2015-11-13 DIAGNOSIS — R51 Headache: Secondary | ICD-10-CM | POA: Diagnosis present

## 2015-11-13 DIAGNOSIS — M549 Dorsalgia, unspecified: Secondary | ICD-10-CM | POA: Diagnosis present

## 2015-11-13 DIAGNOSIS — M25661 Stiffness of right knee, not elsewhere classified: Secondary | ICD-10-CM

## 2015-11-13 DIAGNOSIS — Z96651 Presence of right artificial knee joint: Secondary | ICD-10-CM

## 2015-11-13 HISTORY — PX: IRRIGATION AND DEBRIDEMENT KNEE: SHX5185

## 2015-11-13 HISTORY — DX: Edema, unspecified: R60.9

## 2015-11-13 LAB — PROTIME-INR
INR: 1.1
Prothrombin Time: 14.3 seconds (ref 11.4–15.2)

## 2015-11-13 LAB — BASIC METABOLIC PANEL
Anion gap: 11 (ref 5–15)
BUN: 15 mg/dL (ref 6–20)
CALCIUM: 9.6 mg/dL (ref 8.9–10.3)
CO2: 24 mmol/L (ref 22–32)
CREATININE: 1 mg/dL (ref 0.61–1.24)
Chloride: 101 mmol/L (ref 101–111)
Glucose, Bld: 91 mg/dL (ref 65–99)
Potassium: 3.9 mmol/L (ref 3.5–5.1)
SODIUM: 136 mmol/L (ref 135–145)

## 2015-11-13 LAB — CBC
HCT: 35.9 % — ABNORMAL LOW (ref 39.0–52.0)
Hemoglobin: 12.1 g/dL — ABNORMAL LOW (ref 13.0–17.0)
MCH: 29.3 pg (ref 26.0–34.0)
MCHC: 33.7 g/dL (ref 30.0–36.0)
MCV: 86.9 fL (ref 78.0–100.0)
PLATELETS: 611 10*3/uL — AB (ref 150–400)
RBC: 4.13 MIL/uL — AB (ref 4.22–5.81)
RDW: 13.8 % (ref 11.5–15.5)
WBC: 7 10*3/uL (ref 4.0–10.5)

## 2015-11-13 SURGERY — IRRIGATION AND DEBRIDEMENT KNEE
Anesthesia: Regional | Laterality: Right

## 2015-11-13 MED ORDER — HYDROMORPHONE HCL 2 MG PO TABS
2.0000 mg | ORAL_TABLET | ORAL | Status: DC | PRN
  Administered 2015-11-13 – 2015-11-15 (×4): 2 mg via ORAL
  Filled 2015-11-13 (×5): qty 1

## 2015-11-13 MED ORDER — MIDAZOLAM HCL 5 MG/ML IJ SOLN: 2.0000 mg | Freq: Once | INTRAMUSCULAR | Status: DC

## 2015-11-13 MED ORDER — SODIUM CHLORIDE 0.9 % IR SOLN
Status: DC | PRN
  Administered 2015-11-13 (×2): 3000 mL

## 2015-11-13 MED ORDER — LACTATED RINGERS IV SOLN
INTRAVENOUS | Status: DC
  Administered 2015-11-13 (×2): via INTRAVENOUS

## 2015-11-13 MED ORDER — METHOCARBAMOL 500 MG PO TABS: 500.0000 mg | ORAL_TABLET | Freq: Three times a day (TID) | ORAL | Status: DC | PRN

## 2015-11-13 MED ORDER — PHENOL 1.4 % MT LIQD: 1.0000 | OROMUCOSAL | Status: DC | PRN

## 2015-11-13 MED ORDER — ONDANSETRON HCL 4 MG/2ML IJ SOLN: 4.0000 mg | Freq: Four times a day (QID) | INTRAMUSCULAR | Status: DC | PRN

## 2015-11-13 MED ORDER — METOCLOPRAMIDE HCL 5 MG/ML IJ SOLN: 5.0000 mg | Freq: Three times a day (TID) | INTRAMUSCULAR | Status: DC | PRN

## 2015-11-13 MED ORDER — FENTANYL CITRATE (PF) 100 MCG/2ML IJ SOLN
INTRAMUSCULAR | Status: AC
Start: 2015-11-13 — End: 2015-11-13
  Filled 2015-11-13: qty 2

## 2015-11-13 MED ORDER — HYDROMORPHONE HCL 1 MG/ML IJ SOLN: 2.0000 mg | INTRAMUSCULAR | Status: DC | PRN

## 2015-11-13 MED ORDER — FENTANYL CITRATE (PF) 100 MCG/2ML IJ SOLN
100.0000 ug | Freq: Once | INTRAMUSCULAR | Status: AC
  Administered 2015-11-13: 100 ug via INTRAVENOUS

## 2015-11-13 MED ORDER — OXYCODONE-ACETAMINOPHEN 7.5-325 MG PO TABS: 1.0000 | ORAL_TABLET | ORAL | 0 refills | Status: AC | PRN

## 2015-11-13 MED ORDER — ACETAMINOPHEN 650 MG RE SUPP: 650.0000 mg | Freq: Four times a day (QID) | RECTAL | Status: DC | PRN

## 2015-11-13 MED ORDER — EPHEDRINE SULFATE-NACL 50-0.9 MG/10ML-% IV SOSY
PREFILLED_SYRINGE | INTRAVENOUS | Status: DC | PRN
  Administered 2015-11-13 (×2): 10 mg via INTRAVENOUS

## 2015-11-13 MED ORDER — OXYCODONE-ACETAMINOPHEN 7.5-325 MG PO TABS
1.0000 | ORAL_TABLET | ORAL | Status: DC | PRN
  Administered 2015-11-14 – 2015-11-16 (×11): 2 via ORAL
  Filled 2015-11-13 (×11): qty 2

## 2015-11-13 MED ORDER — PANTOPRAZOLE SODIUM 40 MG PO TBEC
40.0000 mg | DELAYED_RELEASE_TABLET | Freq: Every day | ORAL | Status: DC
Start: 2015-11-13 — End: 2015-11-16
  Administered 2015-11-13 – 2015-11-15 (×3): 40 mg via ORAL
  Filled 2015-11-13 (×3): qty 1

## 2015-11-13 MED ORDER — SODIUM CHLORIDE 0.9 % IV SOLN
INTRAVENOUS | Status: DC
  Administered 2015-11-13: 18:00:00 via INTRAVENOUS

## 2015-11-13 MED ORDER — ONDANSETRON HCL 4 MG PO TABS: 4.0000 mg | ORAL_TABLET | Freq: Four times a day (QID) | ORAL | Status: DC | PRN

## 2015-11-13 MED ORDER — BISACODYL 10 MG RE SUPP: 10.0000 mg | Freq: Every day | RECTAL | Status: DC | PRN

## 2015-11-13 MED ORDER — ASPIRIN 81 MG PO CHEW: 81.0000 mg | CHEWABLE_TABLET | Freq: Two times a day (BID) | ORAL | 0 refills | Status: AC

## 2015-11-13 MED ORDER — MIDAZOLAM HCL 2 MG/2ML IJ SOLN
INTRAMUSCULAR | Status: AC
  Filled 2015-11-13: qty 2

## 2015-11-13 MED ORDER — ONDANSETRON HCL 4 MG/2ML IJ SOLN
INTRAMUSCULAR | Status: DC | PRN
Start: 2015-11-13 — End: 2015-11-13
  Administered 2015-11-13: 4 mg via INTRAVENOUS

## 2015-11-13 MED ORDER — HYDROMORPHONE HCL 1 MG/ML IJ SOLN
INTRAMUSCULAR | Status: AC
  Administered 2015-11-13: 0.5 mg via INTRAVENOUS
  Filled 2015-11-13: qty 1

## 2015-11-13 MED ORDER — FENTANYL CITRATE (PF) 100 MCG/2ML IJ SOLN
INTRAMUSCULAR | Status: DC | PRN
  Administered 2015-11-13 (×4): 25 ug via INTRAVENOUS

## 2015-11-13 MED ORDER — CEFAZOLIN SODIUM-DEXTROSE 2-4 GM/100ML-% IV SOLN
2.0000 g | Freq: Four times a day (QID) | INTRAVENOUS | Status: AC
  Administered 2015-11-13 – 2015-11-14 (×2): 2 g via INTRAVENOUS
  Filled 2015-11-13 (×2): qty 100

## 2015-11-13 MED ORDER — CEFAZOLIN SODIUM-DEXTROSE 2-4 GM/100ML-% IV SOLN
2.0000 g | INTRAVENOUS | Status: AC
  Administered 2015-11-13: 2 g via INTRAVENOUS
  Filled 2015-11-13: qty 100

## 2015-11-13 MED ORDER — METOCLOPRAMIDE HCL 5 MG PO TABS: 5.0000 mg | ORAL_TABLET | Freq: Three times a day (TID) | ORAL | Status: DC | PRN

## 2015-11-13 MED ORDER — ACETAMINOPHEN 325 MG PO TABS: 650.0000 mg | ORAL_TABLET | Freq: Four times a day (QID) | ORAL | Status: DC | PRN

## 2015-11-13 MED ORDER — PROPOFOL 10 MG/ML IV BOLUS
INTRAVENOUS | Status: DC | PRN
  Administered 2015-11-13: 200 mg via INTRAVENOUS

## 2015-11-13 MED ORDER — HYDROMORPHONE HCL 2 MG PO TABS: 2.0000 mg | ORAL_TABLET | Freq: Four times a day (QID) | ORAL | 0 refills | Status: AC | PRN

## 2015-11-13 MED ORDER — METHOCARBAMOL 500 MG PO TABS: 500.0000 mg | ORAL_TABLET | Freq: Three times a day (TID) | ORAL | 1 refills | Status: AC | PRN

## 2015-11-13 MED ORDER — HYDROMORPHONE HCL 1 MG/ML IJ SOLN
0.2500 mg | INTRAMUSCULAR | Status: DC | PRN
  Administered 2015-11-13 (×4): 0.5 mg via INTRAVENOUS

## 2015-11-13 MED ORDER — METHOCARBAMOL 500 MG PO TABS
500.0000 mg | ORAL_TABLET | Freq: Four times a day (QID) | ORAL | Status: DC | PRN
  Administered 2015-11-13 – 2015-11-16 (×8): 500 mg via ORAL
  Filled 2015-11-13 (×7): qty 1

## 2015-11-13 MED ORDER — POLYETHYLENE GLYCOL 3350 17 G PO PACK: 17.0000 g | PACK | Freq: Every day | ORAL | Status: DC | PRN

## 2015-11-13 MED ORDER — MIDAZOLAM HCL 2 MG/2ML IJ SOLN
INTRAMUSCULAR | Status: AC
  Administered 2015-11-13: 2 mg
  Filled 2015-11-13: qty 2

## 2015-11-13 MED ORDER — MUPIROCIN 2 % EX OINT
1.0000 "application " | TOPICAL_OINTMENT | Freq: Every day | CUTANEOUS | Status: DC
  Filled 2015-11-13: qty 22

## 2015-11-13 MED ORDER — METHOCARBAMOL 1000 MG/10ML IJ SOLN
500.0000 mg | Freq: Four times a day (QID) | INTRAVENOUS | Status: DC | PRN
  Filled 2015-11-13: qty 5

## 2015-11-13 MED ORDER — PROPOFOL 10 MG/ML IV BOLUS
INTRAVENOUS | Status: AC
  Filled 2015-11-13: qty 20

## 2015-11-13 MED ORDER — ONDANSETRON HCL 4 MG/2ML IJ SOLN
INTRAMUSCULAR | Status: AC
  Filled 2015-11-13: qty 2

## 2015-11-13 MED ORDER — METHOCARBAMOL 500 MG PO TABS
ORAL_TABLET | ORAL | Status: AC
  Administered 2015-11-13: 17:00:00
  Filled 2015-11-13: qty 1

## 2015-11-13 MED ORDER — LIDOCAINE 2% (20 MG/ML) 5 ML SYRINGE
INTRAMUSCULAR | Status: DC | PRN
  Administered 2015-11-13: 60 mg via INTRAVENOUS

## 2015-11-13 MED ORDER — MENTHOL 3 MG MT LOZG: 1.0000 | LOZENGE | OROMUCOSAL | Status: DC | PRN

## 2015-11-13 MED ORDER — FERROUS SULFATE 325 (65 FE) MG PO TABS
325.0000 mg | ORAL_TABLET | Freq: Three times a day (TID) | ORAL | Status: DC
  Administered 2015-11-13 – 2015-11-15 (×7): 325 mg via ORAL
  Filled 2015-11-13 (×6): qty 1

## 2015-11-13 MED ORDER — ROPIVACAINE HCL 7.5 MG/ML IJ SOLN
INTRAMUSCULAR | Status: DC | PRN
  Administered 2015-11-13: 20 mL via PERINEURAL

## 2015-11-13 MED ORDER — ASPIRIN 81 MG PO CHEW
81.0000 mg | CHEWABLE_TABLET | Freq: Two times a day (BID) | ORAL | Status: DC
  Administered 2015-11-13 – 2015-11-15 (×5): 81 mg via ORAL
  Filled 2015-11-13 (×5): qty 1

## 2015-11-13 MED ORDER — FENTANYL CITRATE (PF) 100 MCG/2ML IJ SOLN
INTRAMUSCULAR | Status: AC
  Filled 2015-11-13: qty 2

## 2015-11-13 MED ORDER — CHLORHEXIDINE GLUCONATE 4 % EX LIQD: 60.0000 mL | Freq: Once | CUTANEOUS | Status: DC

## 2015-11-13 SURGICAL SUPPLY — 55 items
BANDAGE ACE 4X5 VEL STRL LF (GAUZE/BANDAGES/DRESSINGS) ×3 IMPLANT
BNDG COHESIVE 4X5 TAN STRL (GAUZE/BANDAGES/DRESSINGS) ×3 IMPLANT
BNDG GAUZE ELAST 4 BULKY (GAUZE/BANDAGES/DRESSINGS) ×6 IMPLANT
BRUSH SCRUB DISP (MISCELLANEOUS) IMPLANT
CUFF TOURNIQUET SINGLE 18IN (TOURNIQUET CUFF) ×3 IMPLANT
CUFF TOURNIQUET SINGLE 24IN (TOURNIQUET CUFF) IMPLANT
CUFF TOURNIQUET SINGLE 34IN LL (TOURNIQUET CUFF) IMPLANT
CUFF TOURNIQUET SINGLE 44IN (TOURNIQUET CUFF) IMPLANT
DRAPE IMP U-DRAPE 54X76 (DRAPES) ×3 IMPLANT
DRAPE U-SHAPE 47X51 STRL (DRAPES) ×3 IMPLANT
DRSG ADAPTIC 3X8 NADH LF (GAUZE/BANDAGES/DRESSINGS) ×3 IMPLANT
DRSG PAD ABDOMINAL 8X10 ST (GAUZE/BANDAGES/DRESSINGS) ×6 IMPLANT
ELECT REM PT RETURN 9FT ADLT (ELECTROSURGICAL)
ELECTRODE REM PT RTRN 9FT ADLT (ELECTROSURGICAL) IMPLANT
FACESHIELD WRAPAROUND (MASK) IMPLANT
GAUZE SPONGE 4X4 12PLY STRL (GAUZE/BANDAGES/DRESSINGS) ×3 IMPLANT
GLOVE BIOGEL PI ORTHO PRO 7.5 (GLOVE) ×4
GLOVE BIOGEL PI ORTHO PRO SZ8 (GLOVE) ×4
GLOVE ORTHO TXT STRL SZ7.5 (GLOVE) ×6 IMPLANT
GLOVE PI ORTHO PRO STRL 7.5 (GLOVE) ×2 IMPLANT
GLOVE PI ORTHO PRO STRL SZ8 (GLOVE) ×2 IMPLANT
GLOVE SURG ORTHO 8.5 STRL (GLOVE) ×6 IMPLANT
GOWN STRL REUS W/ TWL LRG LVL3 (GOWN DISPOSABLE) ×1 IMPLANT
GOWN STRL REUS W/ TWL XL LVL3 (GOWN DISPOSABLE) ×3 IMPLANT
GOWN STRL REUS W/TWL LRG LVL3 (GOWN DISPOSABLE) ×2
GOWN STRL REUS W/TWL XL LVL3 (GOWN DISPOSABLE) ×6
HANDPIECE INTERPULSE COAX TIP (DISPOSABLE) ×2
IMMOBILIZER KNEE 22 UNIV (SOFTGOODS) ×3 IMPLANT
KIT BASIN OR (CUSTOM PROCEDURE TRAY) ×3 IMPLANT
KIT ROOM TURNOVER OR (KITS) ×3 IMPLANT
MANIFOLD NEPTUNE II (INSTRUMENTS) ×3 IMPLANT
NS IRRIG 1000ML POUR BTL (IV SOLUTION) ×3 IMPLANT
PACK ORTHO EXTREMITY (CUSTOM PROCEDURE TRAY) ×3 IMPLANT
PACK UNIVERSAL I (CUSTOM PROCEDURE TRAY) IMPLANT
PAD ARMBOARD 7.5X6 YLW CONV (MISCELLANEOUS) ×6 IMPLANT
PENCIL BUTTON HOLSTER BLD 10FT (ELECTRODE) IMPLANT
SET HNDPC FAN SPRY TIP SCT (DISPOSABLE) ×1 IMPLANT
SPONGE GAUZE 4X4 12PLY STER LF (GAUZE/BANDAGES/DRESSINGS) ×3 IMPLANT
SPONGE LAP 18X18 X RAY DECT (DISPOSABLE) IMPLANT
SPONGE LAP 4X18 X RAY DECT (DISPOSABLE) IMPLANT
STOCKINETTE IMPERVIOUS 9X36 MD (GAUZE/BANDAGES/DRESSINGS) ×6 IMPLANT
SUT ETHILON 2 0 FS 18 (SUTURE) IMPLANT
SUT ETHILON 4 0 FS 1 (SUTURE) IMPLANT
SUT VIC AB 1 CT1 27 (SUTURE) ×6
SUT VIC AB 1 CT1 27XBRD ANBCTR (SUTURE) ×3 IMPLANT
SUT VIC AB 2-0 CT1 27 (SUTURE) ×4
SUT VIC AB 2-0 CT1 TAPERPNT 27 (SUTURE) ×2 IMPLANT
TOWEL OR 17X24 6PK STRL BLUE (TOWEL DISPOSABLE) ×3 IMPLANT
TOWEL OR 17X26 10 PK STRL BLUE (TOWEL DISPOSABLE) ×3 IMPLANT
TUBE ANAEROBIC SPECIMEN COL (MISCELLANEOUS) IMPLANT
TUBE CONNECTING 12'X1/4 (SUCTIONS)
TUBE CONNECTING 12X1/4 (SUCTIONS) IMPLANT
UNDERPAD 30X30 (UNDERPADS AND DIAPERS) IMPLANT
WATER STERILE IRR 1000ML POUR (IV SOLUTION) IMPLANT
YANKAUER SUCT BULB TIP NO VENT (SUCTIONS) IMPLANT

## 2015-11-13 NOTE — Interval H&P Note (Signed)
History and Physical Interval Note:  11/13/2015 1:48 PM  Tommy Odom  has presented today for surgery, with the diagnosis of RIGHT KNEE STIFFNESS/S/P TOTAL KNEE REPLACEMENT  The various methods of treatment have been discussed with the patient and family. After consideration of risks, benefits and other options for treatment, the patient has consented to  Procedure(s): RIGHT KNEE OPEN IRRIGATION AND DEBRIDEMENT WITH MANIPULATION (Right) as a surgical intervention .  The patient's history has been reviewed, patient examined, no change in status, stable for surgery.  I have reviewed the patient's chart and labs.  Questions were answered to the patient's satisfaction.     Lilas Diefendorf,STEVEN R

## 2015-11-13 NOTE — Discharge Instructions (Signed)
Ice to the knee.  Use knee immobilizer at night to keep knee straight.    CPM for home use 0-90 degrees, 6 hours per day in two hour sessions, increase 5-10 degrees per day  WBAT on the right knee.  Do exercises every hour while awake  Follow up in two weeks with Dr Ranell PatrickNorris [phone]

## 2015-11-13 NOTE — Progress Notes (Signed)
Orthopedic Tech Progress Note Patient Details:  [facility] 08/21/1960 [id number]  CPM Right Knee Right Knee Flexion (Degrees): 90 Right Knee Extension (Degrees): 0 Additional Comments: trapeze bar patient helper   Nikki DomCrawford, Issa Kosmicki 11/13/2015, 4:08 PM Viewed order from doctor's order list

## 2015-11-13 NOTE — Anesthesia Procedure Notes (Addendum)
Anesthesia Regional Block:  Adductor canal block  Pre-Anesthetic Checklist: ,, timeout performed, Correct Patient, Correct Site, Correct Laterality, Correct Procedure, Correct Position, site marked, Risks and benefits discussed, pre-op evaluation,  At surgeon's request and post-op pain management  Laterality: Right  Prep: Maximum Sterile Barrier Precautions used, chloraprep       Needles:  Injection technique: Single-shot  Needle Type: Echogenic Stimulator Needle     Needle Length: 9cm 9 cm Needle Gauge: 21 and 21 G    Additional Needles:  Procedures: ultrasound guided (picture in chart) Adductor canal block Narrative:  Start time: 11/13/2015 1:46 PM End time: 11/13/2015 1:56 PM Injection made incrementally with aspirations every 5 mL. Anesthesiologist: Gaynelle AduFITZGERALD, Karagan Lehr  Additional Notes: 2% Lidocaine skin wheel.

## 2015-11-13 NOTE — Transfer of Care (Signed)
Immediate Anesthesia Transfer of Care Note  Patient: Tommy Odom  Procedure(s) Performed: Procedure(s): RIGHT KNEE OPEN IRRIGATION AND DEBRIDEMENT WITH MANIPULATION (Right)  Patient Location: PACU  Anesthesia Type:GA combined with regional for post-op pain  Level of Consciousness: awake, alert  and oriented  Airway & Oxygen Therapy: Patient Spontanous Breathing and Patient connected to nasal cannula oxygen  Post-op Assessment: Report given to RN, Post -op Vital signs reviewed and stable and Patient moving all extremities X 4  Post vital signs: Reviewed and stable  Last Vitals:  Vitals:   11/13/15 1323  BP: (!) 189/109  Pulse: 81  Resp: 18  Temp: 36.3 C    Last Pain: There were no vitals filed for this visit.    Patients Stated Pain Goal: 2 (11/13/15 1315)  Complications: No apparent anesthesia complications

## 2015-11-13 NOTE — Anesthesia Procedure Notes (Signed)
Procedure Name: LMA Insertion Date/Time: 11/13/2015 2:25 PM Performed by: Sharlene DoryWALKER, Tommy Lupercio E Pre-anesthesia Checklist: Patient identified, Emergency Drugs available, Suction available and Patient being monitored Patient Re-evaluated:Patient Re-evaluated prior to inductionOxygen Delivery Method: Circle system utilized Preoxygenation: Pre-oxygenation with 100% oxygen Intubation Type: IV induction LMA: LMA inserted LMA Size: 4.0 Number of attempts: 1 Placement Confirmation: positive ETCO2 and breath sounds checked- equal and bilateral Tube secured with: Tape Dental Injury: Teeth and Oropharynx as per pre-operative assessment

## 2015-11-13 NOTE — Brief Op Note (Signed)
11/13/2015  3:45 PM  PATIENT:  Tommy RileHaraldo S Odom  55 y.o. male  PRE-OPERATIVE DIAGNOSIS:  RIGHT KNEE BLEEDING AND STIFFNESS/S/P TOTAL KNEE REPLACEMENT  POST-OPERATIVE DIAGNOSIS:  RIGHT KNEE BLEEDING AND STIFFNESS/S/P TOTAL KNEE REPLACEMENT  PROCEDURE:  Procedure(s): RIGHT KNEE OPEN IRRIGATION AND DEBRIDEMENT WITH MANIPULATION (Right) HEMATOMA EVACUATION  SURGEON:  Surgeon(s) and Role:    * Beverely LowSteve Lathon Adan, MD - Primary  PHYSICIAN ASSISTANT:   ASSISTANTS: Thea Gisthomas B Dixon, PA-C   ANESTHESIA:   regional and general  EBL:  Total I/O In: 1000 [I.V.:1000] Out: 100 [Blood:100]  BLOOD ADMINISTERED:none  DRAINS: none   LOCAL MEDICATIONS USED:  NONE  SPECIMEN:  No Specimen  DISPOSITION OF SPECIMEN:  N/A  COUNTS:  YES  TOURNIQUET:  * No tourniquets in log *  DICTATION: .Other Dictation: Dictation Number [id number]  PLAN OF CARE: Admit to inpatient   PATIENT DISPOSITION:  PACU - hemodynamically stable.   Delay start of Pharmacological VTE agent (>24hrs) due to surgical blood loss or risk of bleeding: no

## 2015-11-13 NOTE — Anesthesia Preprocedure Evaluation (Addendum)
Anesthesia Evaluation  Patient identified by MRN, date of birth, ID band Patient awake    Reviewed: Allergy & Precautions, H&P , NPO status , Patient's Chart, lab work & pertinent test results  Airway Mallampati: II  TM Distance: >3 FB Neck ROM: Full    Dental no notable dental hx. (+) Teeth Intact, Dental Advisory Given   Pulmonary neg pulmonary ROS, former smoker,    Pulmonary exam normal breath sounds clear to auscultation       Cardiovascular negative cardio ROS   Rhythm:Regular Rate:Normal     Neuro/Psych  Headaches, negative psych ROS   GI/Hepatic Neg liver ROS, GERD  Medicated and Controlled,  Endo/Other  negative endocrine ROS  Renal/GU negative Renal ROS  negative genitourinary   Musculoskeletal  (+) Arthritis , Osteoarthritis,    Abdominal   Peds  Hematology negative hematology ROS (+)   Anesthesia Other Findings   Reproductive/Obstetrics negative OB ROS                            Anesthesia Physical Anesthesia Plan  ASA: II  Anesthesia Plan: General and Regional   Post-op Pain Management: GA combined w/ Regional for post-op pain   Induction: Intravenous  Airway Management Planned: LMA  Additional Equipment:   Intra-op Plan:   Post-operative Plan: Extubation in OR  Informed Consent: I have reviewed the patients History and Physical, chart, labs and discussed the procedure including the risks, benefits and alternatives for the proposed anesthesia with the patient or authorized representative who has indicated his/her understanding and acceptance.   Dental advisory given  Plan Discussed with: CRNA  Anesthesia Plan Comments:         Anesthesia Quick Evaluation

## 2015-11-14 LAB — CBC
HCT: 29.7 % — ABNORMAL LOW (ref 39.0–52.0)
Hemoglobin: 9.7 g/dL — ABNORMAL LOW (ref 13.0–17.0)
MCH: 28.9 pg (ref 26.0–34.0)
MCHC: 32.7 g/dL (ref 30.0–36.0)
MCV: 88.4 fL (ref 78.0–100.0)
PLATELETS: 465 10*3/uL — AB (ref 150–400)
RBC: 3.36 MIL/uL — ABNORMAL LOW (ref 4.22–5.81)
RDW: 13.6 % (ref 11.5–15.5)
WBC: 7.1 10*3/uL (ref 4.0–10.5)

## 2015-11-14 LAB — BASIC METABOLIC PANEL
Anion gap: 7 (ref 5–15)
BUN: 11 mg/dL (ref 6–20)
CALCIUM: 8.7 mg/dL — AB (ref 8.9–10.3)
CO2: 29 mmol/L (ref 22–32)
Chloride: 99 mmol/L — ABNORMAL LOW (ref 101–111)
Creatinine, Ser: 1 mg/dL (ref 0.61–1.24)
GFR calc Af Amer: >60 mL/min (ref >60)
GLUCOSE: 124 mg/dL — AB (ref 65–99)
POTASSIUM: 3.9 mmol/L (ref 3.5–5.1)
Sodium: 135 mmol/L (ref 135–145)

## 2015-11-14 NOTE — Progress Notes (Signed)
Orthopedic Tech Progress Note Patient Details:  Tommy Odom 05/13/1960 [id number]  CPM Right Knee CPM Right Knee: Off Right Knee Flexion (Degrees): 90 Right Knee Extension (Degrees): 0 Additional Comments: trapeze bar patient helper   Tommy Odom 11/14/2015, 6:56 PM

## 2015-11-14 NOTE — Progress Notes (Signed)
Patient Uses CPM two hours at a time. Patient up in chair and walk with PT. Patient made aware he is able to walk with staff as well just press call light.

## 2015-11-14 NOTE — Progress Notes (Signed)
Orthopedics Progress Note  Subjective: Patient complains of thigh pain this morning.  Also mentions he was left in the CPM for 6 hours last night  Objective:  Vitals:   11/13/15 2035 11/14/15 0415  BP: 138/80 123/72  Pulse: 92 80  Resp: 16 16  Temp: 99.1 F (37.3 C) 98.8 F (37.1 C)    General: Awake and alert  Musculoskeletal: right knee dressing CDI, thigh compartments supple but bruised and tender Excellent quad sets and calf pumps and AROM 0-90! Neurovascularly intact  Lab Results  Component Value Date   WBC 7.1 11/14/2015   HGB 9.7 (L) 11/14/2015   HCT 29.7 (L) 11/14/2015   MCV 88.4 11/14/2015   PLT 465 (H) 11/14/2015       Component Value Date/Time   NA 135 11/14/2015 0444   K 3.9 11/14/2015 0444   CL 99 (L) 11/14/2015 0444   CO2 29 11/14/2015 0444   GLUCOSE 124 (H) 11/14/2015 0444   BUN 11 11/14/2015 0444   CREATININE 1.00 11/14/2015 0444   CALCIUM 8.7 (L) 11/14/2015 0444   GFRNONAA >60 11/14/2015 0444   GFRAA >60 11/14/2015 0444    Lab Results  Component Value Date   INR 1.10 11/13/2015   INR 4.58 (HH) 11/05/2015   INR 2.71 10/26/2015    Assessment/Plan: POD #1 s/p Procedure(s): RIGHT KNEE OPEN IRRIGATION AND DEBRIDEMENT WITH MANIPULATION PT, OT, mobilization CPM 6 hours per day in 2 hour increments  DVT prophylaxis with mechanical and aspirin  Almedia BallsSteven R. Ranell PatrickNorris, MD 11/14/2015 7:55 AM

## 2015-11-14 NOTE — Progress Notes (Signed)
Orthopedic Tech Progress Note Patient Details:  Tommy Odom Tommy Odom 05/08/1960 [id number]  CPM Right Knee CPM Right Knee: On Right Knee Flexion (Degrees): 90 Right Knee Extension (Degrees): 0 Additional Comments: trapeze bar patient helper   Tommy Odom 11/14/2015, 6:57 PM

## 2015-11-14 NOTE — Op Note (Signed)
NAMLulu Riding:  Butsch, Raeqwon            ACCOUNT NO.:  [id number]  MEDICAL RECORD NO.:  [id number]  LOCATION:  5N22C                        FACILITY:  [facility]  PHYSICIAN:  Almedia BallsSteven R. Ranell PatrickNorris, M.D. DATE OF BIRTH:  1960/07/31  DATE OF PROCEDURE:  11/13/2015 DATE OF DISCHARGE:                              OPERATIVE REPORT   PREOPERATIVE DIAGNOSIS:  Right knee bleeding and stiffness, status post total knee replacement.  POSTOPERATIVE DIAGNOSIS:  Right knee bleeding and stiffness, status post total knee replacement.  PROCEDURE PERFORMED:  Right knee open hematoma evacuation.  Irrigation, debridement, and manipulation for total knee replacement.  ATTENDING SURGEON:  Almedia BallsSteven R. Ranell PatrickNorris, MD.  ASSISTANT:  Donnie Coffinhomas B. Dixon, PA-C, who scrubbed the entire procedure and necessary for satisfactory completion of surgery.  ANESTHESIA:  General anesthesia was used plus the adductor canal block.  ESTIMATED BLOOD LOSS:  Less than 100 mL.  FLUID REPLACEMENT:  1200 mL crystalloid.  INSTRUMENT COUNTS:  Correct.  COMPLICATIONS:  No complications.  ANTIBIOTICS:  Perioperative antibiotics were given.  INDICATIONS:  The patient is a 55 year old male, who is 3 weeks status post total knee replacement.  The patient was placed on postoperative Coumadin for DVT prophylaxis as well as a Lovenox bridge.  The patient was discharged home, and unfortunately, Home Health was either not approved or not scheduled for him.  As far as nursing and pharmacy's report, patient continued taking Coumadin 5 mg per day as well as Lovenox at the same time and presented with a postoperative hematoma, large amount of swelling in the leg and a supratherapeutic INR of around 4.8.  The patient's Coumadin was stopped, and Lovenox was stopped.  We will allow the knee to rest over the weekend.  Unfortunately as we saw him back this week, his swelling is still significant.  This is preventing him from doing much in the way of  range-of-motion exercises. The patient complains significant stiffness due to large amount of likely blood collection collected in the knee and postoperative stiffness.  We talked to the patient about going back to surgery and performing a hematoma evacuation, manipulation, and I and D, the patient agreed in order to relieve pain and restore function.  Informed consent obtained.  DESCRIPTION OF THE OPERATION:  After adequate level of anesthesia achieved, the patient was positioned in the supine position.  The right leg was correctly identified, nonsterile tourniquet placed in the proximal thigh.  This was not utilized during surgery.  After sterile prep and drape of the right leg, we called our time-out.  We then initiated surgery utilizing the patient's same incision.  We used a fresh 10-blade scalpel to go down through the skin and subcutaneous tissues.  All suture was removed, identified the medial parapatellar arthrotomy and opened up that Vicryl and opened up that arthrotomy as well.  A large amount of hematoma was evacuated.  Irrigation was performed.  We then manipulated the knee.  We were able to regain basically all the range of motion including flexion of 120 and full extension.  We then went ahead and irrigated again as we had a total of 6 L of normal saline irrigation.  We made sure that all hematoma and scar tissue  was evacuated from the knee.  Knee replacement was in good condition and having regained full range of motion, irrigated the knee and removed the hematoma.  We concluded suturing the parapatellar arthrotomy with a #1 Vicryl suture, interrupted simple sutures followed by 0 Vicryl and 2-0 Vicryl layer, subcutaneous closure, and staples.  A sterile compressive bandage was applied followed by a knee immobilizer. The patient tolerated the surgery well.     Almedia Balls. Ranell Patrick, M.D.     SRN/MEDQ  D:  11/13/2015  T:  11/14/2015  Job:  [id number]

## 2015-11-14 NOTE — Evaluation (Signed)
Occupational Therapy Evaluation Patient Details Name: Tommy Odom MRN: [id number] DOB: May 12, 1960 Today's Date: 11/14/2015    History of Present Illness Pt is a 55 y/o male s/p I&D and manipulation of R knee. Pt recently s/p R TKA (10/23/15). No pertinent PMH.   Clinical Impression   Pt reports he was managing ADL independently PTA. Currently pt overall supervision for safety with ADL and functional mobility. Began knee, safety, and ADL education with pt. Pt planning to d/c home with 24/7 supervision from family. Pt would benefit from continued skilled OT to address established goals.   Follow Up Recommendations  No OT follow up;Supervision - Intermittent    Equipment Recommendations  None recommended by OT    Recommendations for Other Services       Precautions / Restrictions Precautions Precautions: Knee Restrictions Weight Bearing Restrictions: Yes RLE Weight Bearing: Weight bearing as tolerated      Mobility Bed Mobility Overal bed mobility: Needs Assistance Bed Mobility: Supine to Sit     Supine to sit: Supervision     General bed mobility comments: Supervision for safety with increased time. Pt able to loop LLE under RLE to assist with movemet to EOB.  Transfers Overall transfer level: Needs assistance Equipment used: Rolling walker (2 wheeled) Transfers: Sit to/from Stand Sit to Stand: Supervision         General transfer comment: Supervision for safety. Good hand placement and technique.    Balance Overall balance assessment: Needs assistance Sitting-balance support: Feet supported;No upper extremity supported Sitting balance-Leahy Scale: Normal     Standing balance support: Bilateral upper extremity supported Standing balance-Leahy Scale: Fair                              ADL Overall ADL's : Needs assistance/impaired Eating/Feeding: Independent;Sitting   Grooming: Supervision/safety;Standing   Upper Body Bathing: Set  up;Supervision/ safety;Sitting   Lower Body Bathing: Set up;Supervison/ safety;Sit to/from stand   Upper Body Dressing : Set up;Supervision/safety;Sitting   Lower Body Dressing: Set up;Supervision/safety;Sit to/from stand   Toilet Transfer: Supervision/safety;Ambulation;BSC;RW Toilet Transfer Details (indicate cue type and reason): Simulated by sit to stand from EOB with functional mobility Toileting- Clothing Manipulation and Hygiene: Supervision/safety;Sit to/from stand       Functional mobility during ADLs: Supervision/safety;Rolling walker General ADL Comments: Pt very motivated to perform OOB activity; understands completion of exercises, use of CPM to prevent stiffness. Pt reports wife to assist with ADL as needed.     Vision Vision Assessment?: No apparent visual deficits   Perception     Praxis      Pertinent Vitals/Pain Pain Assessment: 0-10 Pain Score: 5  Pain Location: R knee Pain Descriptors / Indicators: Sore Pain Intervention(s): Monitored during session;Repositioned;Premedicated before session     Hand Dominance     Extremity/Trunk Assessment Upper Extremity Assessment Upper Extremity Assessment: Overall WFL for tasks assessed   Lower Extremity Assessment Lower Extremity Assessment: Defer to PT evaluation   Cervical / Trunk Assessment Cervical / Trunk Assessment: Normal   Communication Communication Communication: No difficulties   Cognition Arousal/Alertness: Awake/alert Behavior During Therapy: WFL for tasks assessed/performed Overall Cognitive Status: Within Functional Limits for tasks assessed                     General Comments       Exercises       Shoulder Instructions      Home Living Family/patient expects to  be discharged to:: Private residence Living Arrangements: Spouse/significant other Available Help at Discharge: Family;Available 24 hours/day Type of Home: House Home Access: Level entry     Home Layout: One  level     Bathroom Shower/Tub: Tub/shower unit Shower/tub characteristics: Curtain FirefighterBathroom Toilet: Standard     Home Equipment: Environmental consultantWalker - 2 wheels;Bedside commode          Prior Functioning/Environment Level of Independence: Independent with assistive device(s)        Comments: pt using RW for ambulation. Completing BADL independently.        OT Problem List: Impaired balance (sitting and/or standing);Decreased knowledge of use of DME or AE;Pain   OT Treatment/Interventions: Self-care/ADL training;DME and/or AE instruction;Patient/family education;Balance training    OT Goals(Current goals can be found in the care plan section) Acute Rehab OT Goals Patient Stated Goal: return home OT Goal Formulation: With patient Time For Goal Achievement: 11/28/15 Potential to Achieve Goals: Good ADL Goals Pt Will Perform Tub/Shower Transfer: Tub transfer;with supervision;ambulating;3 in 1;rolling walker  OT Frequency: Min 2X/week   Barriers to D/C:            Co-evaluation              End of Session Equipment Utilized During Treatment: Rolling walker CPM Right Knee CPM Right Knee: Off  Activity Tolerance: Patient tolerated treatment well Patient left: in chair;with call bell/phone within reach;with family/visitor present   Time: [id number] OT Time Calculation (min): 23 min Charges:  OT General Charges $OT Visit: 1 Procedure OT Evaluation $OT Eval Moderate Complexity: 1 Procedure OT Treatments $Self Care/Home Management : 8-22 mins G-Codes:     Gaye AlkenBailey A Lynise Porr M.S., OTR/L Pager: [phone]  11/14/2015, 4:16 PM

## 2015-11-14 NOTE — Evaluation (Signed)
Physical Therapy Evaluation Patient Details Name: Tommy Odom MRN: [id number] DOB: [birth year] Today's Date: 11/14/2015   History of Present Illness  Pt is a 55 y/o male s/p I&D and manipulation of R knee. Pt recently s/p R TKA (10/23/15). No pertinent PMH.  Clinical Impression  Pt presented supine in bed with HOB elevated, R LE in CPM, awake and willing to participate in therapy session. Pt moving well during evaluation and tolerated 20 reps of multiple therapeutic exercises (see below). PT encouraged pt to continue to flex and extend his knee as much as possible throughout the day. Pt expressed understanding. Pt would continue to benefit from skilled physical therapy services at this time while admitted and after d/c to address his below listed limitations in order to improve his overall safety and independence with functional mobility.     Follow Up Recommendations Home health PT;Supervision for mobility/OOB    Equipment Recommendations  None recommended by PT    Recommendations for Other Services       Precautions / Restrictions Precautions Precautions: Knee Precaution Booklet Issued: Yes (comment) Restrictions Weight Bearing Restrictions: Yes RLE Weight Bearing: Weight bearing as tolerated      Mobility  Bed Mobility Overal bed mobility: Needs Assistance Bed Mobility: Supine to Sit     Supine to sit: Supervision;HOB elevated     General bed mobility comments: pt required increased time  Transfers Overall transfer level: Needs assistance Equipment used: Rolling walker (2 wheeled) Transfers: Sit to/from Stand Sit to Stand: Supervision         General transfer comment: Supervision for safety. Safe hand placement/technique.  Ambulation/Gait Ambulation/Gait assistance: Min guard Ambulation Distance (Feet): 150 Feet Assistive device: Rolling walker (2 wheeled) Gait Pattern/deviations: Step-through pattern;Decreased step length - left;Decreased stance time -  right;Decreased weight shift to right Gait velocity: decreased Gait velocity interpretation: Below normal speed for age/gender General Gait Details: pt demonstrated safe technique with RW  Stairs            Wheelchair Mobility    Modified Rankin (Stroke Patients Only)       Balance Overall balance assessment: Needs assistance Sitting-balance support: Feet supported;No upper extremity supported Sitting balance-Leahy Scale: Good     Standing balance support: During functional activity;No upper extremity supported Standing balance-Leahy Scale: Fair                               Pertinent Vitals/Pain Pain Assessment: No/denies pain Pain Intervention(s): Monitored during session    Home Living Family/patient expects to be discharged to:: Private residence Living Arrangements: Spouse/significant other Available Help at Discharge: Family;Available 24 hours/day Type of Home: House Home Access: Level entry     Home Layout: One level Home Equipment: Walker - 2 wheels;Bedside commode      Prior Function Level of Independence: Independent with assistive device(s)         Comments: pt using RW for ambulation     Hand Dominance        Extremity/Trunk Assessment   Upper Extremity Assessment: Overall WFL for tasks assessed           Lower Extremity Assessment: RLE deficits/detail RLE Deficits / Details: Pt with decreased strength and ROM limitations secondary to post-op. Flexion = 78 degrees (with overpressure), Extension = lacking 15 degreees to neutral (with overpressure); measured in sitting       Communication   Communication: No difficulties  Cognition Arousal/Alertness: Awake/alert Behavior During  Therapy: WFL for tasks assessed/performed Overall Cognitive Status: Within Functional Limits for tasks assessed                      General Comments      Exercises Total Joint Exercises Quad Sets: AROM;Strengthening;Right;20  reps;Seated Heel Slides: AROM;Strengthening;Right;20 reps;Seated Long Arc Quad: AAROM;Right;20 reps;Seated Knee Flexion: AROM;AAROM;Right;20 reps;Seated Goniometric ROM: Flexion = 78 degrees; Extension = lacking 15 degrees to neutral; measured in sitting, both with overpressure   Assessment/Plan    PT Assessment Patient needs continued PT services  PT Problem List Decreased strength;Decreased range of motion;Decreased activity tolerance;Decreased balance;Decreased mobility;Decreased coordination;Pain          PT Treatment Interventions DME instruction;Gait training;Stair training;Functional mobility training;Therapeutic activities;Therapeutic exercise;Neuromuscular re-education;Balance training;Patient/family education    PT Goals (Current goals can be found in the Care Plan section)  Acute Rehab PT Goals Patient Stated Goal: return home PT Goal Formulation: With patient Time For Goal Achievement: 11/21/15 Potential to Achieve Goals: Good    Frequency 7X/week   Barriers to discharge        Co-evaluation               End of Session Equipment Utilized During Treatment: Gait belt Activity Tolerance: Patient tolerated treatment well Patient left: in chair;with call bell/phone within reach;with family/visitor present Nurse Communication: Mobility status         Time: [id number] PT Time Calculation (min) (ACUTE ONLY): 31 min   Charges:   PT Evaluation $PT Eval Moderate Complexity: 1 Procedure PT Treatments $Therapeutic Exercise: 8-22 mins   PT G CodesAlessandra Bevels:        [name] 11/14/2015, 10:52 AM [name], PT, DPT [phone]

## 2015-11-15 LAB — CBC
HEMATOCRIT: 29.2 % — AB (ref 39.0–52.0)
Hemoglobin: 9.5 g/dL — ABNORMAL LOW (ref 13.0–17.0)
MCH: 28.8 pg (ref 26.0–34.0)
MCHC: 32.5 g/dL (ref 30.0–36.0)
MCV: 88.5 fL (ref 78.0–100.0)
PLATELETS: 453 10*3/uL — AB (ref 150–400)
RBC: 3.3 MIL/uL — ABNORMAL LOW (ref 4.22–5.81)
RDW: 13.7 % (ref 11.5–15.5)
WBC: 8.3 10*3/uL (ref 4.0–10.5)

## 2015-11-15 LAB — GLUCOSE, CAPILLARY: GLUCOSE-CAPILLARY: 111 mg/dL — AB (ref 65–99)

## 2015-11-15 NOTE — Progress Notes (Signed)
Orthopedic Tech Progress Note Patient Details:  Tommy Odom 10/13/1960 [id number]  CPM Right Knee CPM Right Knee: On Right Knee Flexion (Degrees): 0 Right Knee Extension (Degrees): 90 Additional Comments: bone foam on   Saul FordyceJennifer C Kaire Stary 11/15/2015, 3:44 PM

## 2015-11-15 NOTE — Progress Notes (Signed)
Orthopedics Progress Note  Subjective: Patient reporting moderate pain and wanting to do more with therapy  Objective:  Vitals:   11/15/15 0453 11/15/15 1247  BP: 127/76 133/79  Pulse: 85 87  Resp:  16  Temp: 98.3 F (36.8 C) 98.1 F (36.7 C)    General: Awake and alert  Musculoskeletal: right knee bandage changed.  Bruising noted around the knee as expected. No active drainage.  Neurovascularly intact  Lab Results  Component Value Date   WBC 8.3 11/15/2015   HGB 9.5 (L) 11/15/2015   HCT 29.2 (L) 11/15/2015   MCV 88.5 11/15/2015   PLT 453 (H) 11/15/2015       Component Value Date/Time   NA 135 11/14/2015 0444   K 3.9 11/14/2015 0444   CL 99 (L) 11/14/2015 0444   CO2 29 11/14/2015 0444   GLUCOSE 124 (H) 11/14/2015 0444   BUN 11 11/14/2015 0444   CREATININE 1.00 11/14/2015 0444   CALCIUM 8.7 (L) 11/14/2015 0444   GFRNONAA >60 11/14/2015 0444   GFRAA >60 11/14/2015 0444    Lab Results  Component Value Date   INR 1.10 11/13/2015   INR 4.58 (HH) 11/05/2015   INR 2.71 10/26/2015    Assessment/Plan: POD #2  s/p Procedure(s): RIGHT KNEE OPEN IRRIGATION AND DEBRIDEMENT WITH MANIPULATION Continue PT, OT, mobilization Home tomorrow with home health PT, OT DVT prophylaxis with TED hose, SCDs and aspirin(81mg  BID)  Almedia BallsSteven R. Ranell PatrickNorris, MD 11/15/2015 6:46 PM

## 2015-11-15 NOTE — Progress Notes (Signed)
Tommy Odom  MRN: [id number] DOB/Age: 53/08/1960 53 y.o. Physician: Lynnea MaizesK Tivon Lemoine, M.D. 2 Days Post-Op Procedure(s) (LRB): RIGHT KNEE OPEN IRRIGATION AND DEBRIDEMENT WITH MANIPULATION (Right)  Subjective: reports minimal pain, restless night, anxious to get up and walk Vital Signs Temp:  [97.7 F (36.5 C)-98.5 F (36.9 C)] 98.3 F (36.8 C) (10/08 0453) Pulse Rate:  [85-90] 85 (10/08 0453) Resp:  [16] 16 (10/07 1342) BP: (112-132)/(62-78) 127/76 (10/08 0453) SpO2:  [95 %-100 %] 95 % (10/08 0453)  Lab Results  Recent Labs  11/14/15 0444 11/15/15 0419  WBC 7.1 8.3  HGB 9.7* 9.5*  HCT 29.7* 29.2*  PLT 465* 453*   BMET  Recent Labs  11/13/15 1320 11/14/15 0444  NA 136 135  K 3.9 3.9  CL 101 99*  CO2 24 29  GLUCOSE 91 124*  BUN 15 11  CREATININE 1.00 1.00  CALCIUM 9.6 8.7*   INR  Date Value Ref Range Status  11/13/2015 1.10  Final     Exam  In CPM, flexion to almost 90, good ankle motion, N/V intact distally.  Plan Continue CPM and PT for ROM and mobility. Shawn Dannenberg M Ladell Bey 11/15/2015, 8:48 AM    Contact # [phone]

## 2015-11-15 NOTE — Progress Notes (Signed)
Physical Therapy Treatment Patient Details Name: Tommy Odom MRN: [id number] DOB: 05/21/1960 Today's Date: 11/15/2015    History of Present Illness Pt is a 55 y/o male s/p I&D and manipulation of R knee. Pt recently s/p R TKA (10/23/15). No pertinent PMH.    PT Comments    Pt is progressing well with his gait and mobility he is very guarded and has a pretty firm end feel for his knee flexion ROM.  I encouraged him to push himself as much as he could tolerate and then some more for his knee flexion.  He also has a pretty significant extension lag, but that end feel has some give to it right now.  He continues to benefit from acute PT and post acute f/u at discharge.  I encouraged him to walk again with family and/or staff later.   Follow Up Recommendations  Home health PT;Supervision for mobility/OOB     Equipment Recommendations  None recommended by PT    Recommendations for Other Services   NA     Precautions / Restrictions Precautions Precautions: Knee Precaution Comments: reviewed knee precuations and positioning.  Restrictions RLE Weight Bearing: Weight bearing as tolerated    Mobility  Bed Mobility Overal bed mobility: Needs Assistance Bed Mobility: Supine to Sit;Sit to Supine     Supine to sit: Modified independent (Device/Increase time) Sit to supine: Modified independent (Device/Increase time)   General bed mobility comments: uses hands and stronger leg to move his right leg into and out of bed.  Transfers Overall transfer level: Needs assistance Equipment used: Rolling walker (2 wheeled) Transfers: Sit to/from Stand Sit to Stand: Supervision         General transfer comment: supervision for safety.  Pt at times quick to move.   Ambulation/Gait Ambulation/Gait assistance: Supervision Ambulation Distance (Feet): 450 Feet Assistive device: Rolling walker (2 wheeled) Gait Pattern/deviations: Step-through pattern;Antalgic Gait velocity: encouraged him  to slow down and focus on a more normal gait pattern Gait velocity interpretation: at or above normal speed for age/gender General Gait Details: Verbal cues for lighter weight on his hands and more normal heel to toe gait pattern with his right leg.           Balance Overall balance assessment: Needs assistance Sitting-balance support: Feet supported;No upper extremity supported Sitting balance-Leahy Scale: Good     Standing balance support: Bilateral upper extremity supported;No upper extremity supported;Single extremity supported Standing balance-Leahy Scale: Fair                      Cognition Arousal/Alertness: Awake/alert Behavior During Therapy: WFL for tasks assessed/performed Overall Cognitive Status: Within Functional Limits for tasks assessed                      Exercises Total Joint Exercises Ankle Circles/Pumps: AROM;Both;20 reps Quad Sets: AROM;Left;10 reps (5 second holds, verbal cues for technique) Towel Squeeze: AROM;Both;10 reps Short Arc QuadBarbaraann Boys: AAROM;Right;10 reps;Seated Heel Slides: AAROM;Right;10 reps Hip ABduction/ADduction: AAROM;Right;10 reps Straight Leg Raises: AAROM;Right;10 reps (worked on Oceanographereccentric control) Long Arc Quad: AAROM;Right;10 reps;Seated (worked on eccentric control, slower speed) Knee Flexion: AROM;AAROM;Right;10 reps;Seated Goniometric ROM: 15-75        Pertinent Vitals/Pain Pain Assessment: 0-10 Pain Score: 6  Pain Location: right knee Pain Descriptors / Indicators: Sore Pain Intervention(s): Limited activity within patient's tolerance;Monitored during session;Repositioned           PT Goals (current goals can now be found in the care plan  section) Acute Rehab PT Goals Patient Stated Goal: return home Progress towards PT goals: Progressing toward goals    Frequency    7X/week      PT Plan Current plan remains appropriate       End of Session   Activity Tolerance: Patient limited by  pain Patient left: with call bell/phone within reach;with family/visitor present;in bed     Time: 1441-1510 PT Time Calculation (min) (ACUTE ONLY): 29 min  Charges:  $Gait Training: 8-22 mins $Therapeutic Exercise: 8-22 mins                      [name], PT, DPT [phone]   11/15/2015, 4:19 PM

## 2015-11-16 ENCOUNTER — Encounter (HOSPITAL_COMMUNITY): Payer: Self-pay | Admitting: Orthopedic Surgery

## 2015-11-16 LAB — GLUCOSE, CAPILLARY: Glucose-Capillary: 92 mg/dL (ref 65–99)

## 2015-11-16 LAB — CBC
HCT: 28.7 % — ABNORMAL LOW (ref 39.0–52.0)
HEMOGLOBIN: 9.3 g/dL — AB (ref 13.0–17.0)
MCH: 28.7 pg (ref 26.0–34.0)
MCHC: 32.4 g/dL (ref 30.0–36.0)
MCV: 88.6 fL (ref 78.0–100.0)
Platelets: 428 10*3/uL — ABNORMAL HIGH (ref 150–400)
RBC: 3.24 MIL/uL — ABNORMAL LOW (ref 4.22–5.81)
RDW: 13.6 % (ref 11.5–15.5)
WBC: 6.1 10*3/uL (ref 4.0–10.5)

## 2015-11-16 NOTE — Progress Notes (Signed)
Occupational Therapy Treatment Patient Details Name: Tommy Odom MRN: [id number] DOB: 22-Mar-1960 Today's Date: 11/16/2015    History of present illness Pt is a 55 y/o male s/p I&D and manipulation of R knee. Pt recently s/p R TKA (10/23/15). No pertinent PMH.   OT comments  Pt with good progression with OT during admission. Completed education with pt and wife concerning knee precautions during ADL, fall prevention, and safe tub transfer with 3-in-1 BSC. Pt currently requires supervision with all ADL and plans to D/C home with wife who can provide the necessary intermittent supervision. D/C plan remains appropriate. Pt has no further acute OT needs. OT signing off.   Follow Up Recommendations  No OT follow up;Supervision - Intermittent    Equipment Recommendations  None recommended by OT       Precautions / Restrictions Precautions Precautions: Knee Precaution Booklet Issued: No Precaution Comments: Reviewed knee precautions during ADL. Restrictions Weight Bearing Restrictions: Yes RLE Weight Bearing: Weight bearing as tolerated       Mobility Bed Mobility Overal bed mobility: Independent Bed Mobility: Supine to Sit     Supine to sit: Independent     General bed mobility comments: Pt out of bed in chair upon OT arrival.  Transfers Overall transfer level: Needs assistance Equipment used: Rolling walker (2 wheeled) Transfers: Sit to/from Stand Sit to Stand: Supervision         General transfer comment: good stability     Balance Overall balance assessment: Needs assistance Sitting-balance support: No upper extremity supported;Feet supported Sitting balance-Leahy Scale: Normal     Standing balance support: During functional activity;Single extremity supported Standing balance-Leahy Scale: Fair Standing balance comment: able to stand static without UE support                   ADL Overall ADL's : Needs assistance/impaired                      Lower Body Dressing: Supervision/safety;Set up;Sit to/from stand   Toilet Transfer: Supervision/safety;BSC;Ambulation;RW       Tub/ Banker: Supervision/safety;3 in Art therapist mobility during ADLs: Supervision/safety;Rolling walker General ADL Comments: Educated pt on safe tub transfer with 3 in 1 BSC. Pt able to complete all ADL with supervision for safety at this time and verbal cues for use of RW to increase safety.                Cognition   Behavior During Therapy: WFL for tasks assessed/performed Overall Cognitive Status: Within Functional Limits for tasks assessed                         Exercises Total Joint Exercises Heel Slides: AAROM;Right;20 reps Knee Flexion: AAROM;Right;20 reps Goniometric ROM: seated 75 degrees, supine 65 (Rt knee flexion)           Pertinent Vitals/ Pain       Pain Assessment: Faces Faces Pain Scale: Hurts a little bit Pain Location: R knee Pain Descriptors / Indicators: Operative site guarding;Sore Pain Intervention(s): Monitored during session;Repositioned         Frequency  Min 2X/week        Progress Toward Goals  OT Goals(current goals can now be found in the care plan section)  Progress towards OT goals: Goals met/education completed, patient discharged from OT  Acute Rehab OT Goals Patient Stated Goal: return home OT Goal Formulation: With patient Time For Goal Achievement:  11/28/15 Potential to Achieve Goals: Good ADL Goals Pt Will Perform Tub/Shower Transfer: Tub transfer;with supervision;ambulating;3 in 1;rolling walker  Plan Discharge plan remains appropriate       End of Session Equipment Utilized During Treatment: Rolling walker   Activity Tolerance Patient tolerated treatment well   Patient Left in chair;with call bell/phone within reach;with family/visitor present   Nurse Communication Other (comment) (OT complete)        Time: 6381-7711 OT Time  Calculation (min): 16 min  Charges: OT General Charges $OT Visit: 1 Procedure OT Treatments $Self Care/Home Management : 8-22 mins  Norman Herrlich, OTR/L [phone] 11/16/2015, 9:56 AM

## 2015-11-16 NOTE — Progress Notes (Signed)
Physical Therapy Treatment Patient Details Name: Tommy Odom MRN: [id number] DOB: 1960-05-13 Today's Date: 11/16/2015    History of Present Illness Pt is a 55 y/o male s/p I&D and manipulation of R knee. Pt recently s/p R TKA (10/23/15). No pertinent PMH.    PT Comments    Patient is making good progress with PT.  From a mobility standpoint anticipate patient will be ready for DC home with family support. Pt reports that he has been talking with his PT and will resume PT services following his D/C. Patient denies any questions or concerns.      Follow Up Recommendations  Home health PT;Supervision for mobility/OOB     Equipment Recommendations  None recommended by PT    Recommendations for Other Services       Precautions / Restrictions Precautions Precautions: Knee Restrictions Weight Bearing Restrictions: Yes RLE Weight Bearing: Weight bearing as tolerated    Mobility  Bed Mobility Overal bed mobility: Independent Bed Mobility: Supine to Sit     Supine to sit: Independent        Transfers Overall transfer level: Modified independent Equipment used: None Transfers: Sit to/from Stand Sit to Stand: Independent         General transfer comment: good stability   Ambulation/Gait Ambulation/Gait assistance: Supervision Ambulation Distance (Feet): 400 Feet Assistive device: Rolling walker (2 wheeled) Gait Pattern/deviations: Step-through pattern Gait velocity: mild decrease   General Gait Details:  cues for weightbearing through Rt LE   Stairs            Wheelchair Mobility    Modified Rankin (Stroke Patients Only)       Balance Overall balance assessment: Needs assistance Sitting-balance support: No upper extremity supported Sitting balance-Leahy Scale: Normal     Standing balance support: During functional activity Standing balance-Leahy Scale: Fair Standing balance comment: able to stand static without UE support                     Cognition Arousal/Alertness: Awake/alert Behavior During Therapy: WFL for tasks assessed/performed Overall Cognitive Status: Within Functional Limits for tasks assessed                      Exercises Total Joint Exercises Heel Slides: AAROM;Right;20 reps Knee Flexion: AAROM;Right;20 reps Goniometric ROM: seated 75 degrees, supine 65 (Rt knee flexion)    General Comments        Pertinent Vitals/Pain Pain Assessment: Faces Faces Pain Scale: Hurts little more Pain Location: Rt knee Pain Descriptors / Indicators: Sore;Guarding Pain Intervention(s): Limited activity within patient's tolerance;Monitored during session    Home Living                      Prior Function            PT Goals (current goals can now be found in the care plan section) Acute Rehab PT Goals Patient Stated Goal: return home PT Goal Formulation: With patient Time For Goal Achievement: 11/21/15 Potential to Achieve Goals: Good Progress towards PT goals: Progressing toward goals    Frequency    7X/week      PT Plan Current plan remains appropriate    Co-evaluation             End of Session   Activity Tolerance: Patient tolerated treatment well Patient left: in chair;with call bell/phone within reach;with family/visitor present (in bone foam)     Time: 1027-2536 PT Time Calculation (min) (ACUTE  ONLY): 32 min  Charges:  $Gait Training: 8-22 mins $Therapeutic Exercise: 8-22 mins                    G Codes:      Christiane HaBenjamin J. Carly Applegate, PT, CSCS Pager [phone] Office [phone]  11/16/2015, 9:00 AM

## 2015-11-16 NOTE — Progress Notes (Signed)
   Subjective: 3 Days Post-Op Procedure(s) (LRB): RIGHT KNEE OPEN IRRIGATION AND DEBRIDEMENT WITH MANIPULATION (Right)  Pt feeling better  Much more comfortable after hematoma evacuation  Ready for d/c home once all appropriate home health setup Denies any new symptoms or issues Patient reports pain as mild.  Objective:   VITALS:   Vitals:   11/15/15 2046 11/16/15 0552  BP: 135/76 123/69  Pulse: 90 71  Resp:    Temp: 98.6 F (37 C) 98.7 F (37.1 C)    Right knee incision healing well nv intact distally No edema or effusion Improved rom  LABS  Recent Labs  11/14/15 0444 11/15/15 0419 11/16/15 0413  HGB 9.7* 9.5* 9.3*  HCT 29.7* 29.2* 28.7*  WBC 7.1 8.3 6.1  PLT 465* 453* 428*     Recent Labs  11/13/15 1320 11/14/15 0444  NA 136 135  K 3.9 3.9  BUN 15 11  CREATININE 1.00 1.00  GLUCOSE 91 124*     Assessment/Plan: 3 Days Post-Op Procedure(s) (LRB): RIGHT KNEE OPEN IRRIGATION AND DEBRIDEMENT WITH MANIPULATION (Right) D/c home today once home health and CPM setup Continue daily exercises and PT F/u in 1-2 weeks Pain management as needed    Alphonsa Overall, MPAS, PA-C  11/16/2015, 7:38 AM

## 2015-11-16 NOTE — Discharge Summary (Signed)
Physician Discharge Summary   Patient ID: Tommy Odom MRN: [id number] DOB/AGE: 1961-01-22 55 y.o.  Admit date: 11/13/2015 Discharge date: 11/16/2015  Admission Diagnoses:  Active Problems:   H/O total knee replacement, right   Discharge Diagnoses:  Same   Surgeries: Procedure(s): RIGHT KNEE OPEN IRRIGATION AND DEBRIDEMENT WITH MANIPULATION on 11/13/2015   Consultants: PT/OT, care management  Discharged Condition: Stable  Hospital Course: Tommy Odom is an 55 y.o. male who was admitted 11/13/2015 with a chief complaint of right knee pain/effusion, and found to have a diagnosis of hematoma s/p right total knee arthroplasty.  They were brought to the operating room on 11/13/2015 and underwent the above named procedures.    The patient had an uncomplicated hospital course and was stable for discharge.  Recent vital signs:  Vitals:   11/15/15 2046 11/16/15 0552  BP: 135/76 123/69  Pulse: 90 71  Resp:    Temp: 98.6 F (37 C) 98.7 F (37.1 C)    Recent laboratory studies:  Results for orders placed or performed during the hospital encounter of 11/13/15  Basic metabolic panel  Result Value Ref Range   Sodium 136 135 - 145 mmol/L   Potassium 3.9 3.5 - 5.1 mmol/L   Chloride 101 101 - 111 mmol/L   CO2 24 22 - 32 mmol/L   Glucose, Bld 91 65 - 99 mg/dL   BUN 15 6 - 20 mg/dL   Creatinine, Ser 4.09 0.61 - 1.24 mg/dL   Calcium 9.6 8.9 - 81.1 mg/dL   GFR calc non Af Amer >60 >60 mL/min   GFR calc Af Amer >60 >60 mL/min   Anion gap 11 5 - 15  CBC  Result Value Ref Range   WBC 7.0 4.0 - 10.5 K/uL   RBC 4.13 (L) 4.22 - 5.81 MIL/uL   Hemoglobin 12.1 (L) 13.0 - 17.0 g/dL   HCT 91.4 (L) 78.2 - 95.6 %   MCV 86.9 78.0 - 100.0 fL   MCH 29.3 26.0 - 34.0 pg   MCHC 33.7 30.0 - 36.0 g/dL   RDW 21.3 08.6 - 57.8 %   Platelets 611 (H) 150 - 400 K/uL  PT- INR Day of Surgery  Result Value Ref Range   Prothrombin Time 14.3 11.4 - 15.2 seconds   INR 1.10   CBC  Result Value  Ref Range   WBC 7.1 4.0 - 10.5 K/uL   RBC 3.36 (L) 4.22 - 5.81 MIL/uL   Hemoglobin 9.7 (L) 13.0 - 17.0 g/dL   HCT 46.9 (L) 62.9 - 52.8 %   MCV 88.4 78.0 - 100.0 fL   MCH 28.9 26.0 - 34.0 pg   MCHC 32.7 30.0 - 36.0 g/dL   RDW 41.3 24.4 - 01.0 %   Platelets 465 (H) 150 - 400 K/uL  Basic metabolic panel  Result Value Ref Range   Sodium 135 135 - 145 mmol/L   Potassium 3.9 3.5 - 5.1 mmol/L   Chloride 99 (L) 101 - 111 mmol/L   CO2 29 22 - 32 mmol/L   Glucose, Bld 124 (H) 65 - 99 mg/dL   BUN 11 6 - 20 mg/dL   Creatinine, Ser 2.72 0.61 - 1.24 mg/dL   Calcium 8.7 (L) 8.9 - 10.3 mg/dL   GFR calc non Af Amer >60 >60 mL/min   GFR calc Af Amer >60 >60 mL/min   Anion gap 7 5 - 15  CBC  Result Value Ref Range   WBC 8.3 4.0 - 10.5 K/uL  RBC 3.30 (L) 4.22 - 5.81 MIL/uL   Hemoglobin 9.5 (L) 13.0 - 17.0 g/dL   HCT 16.129.2 (L) 09.639.0 - 04.552.0 %   MCV 88.5 78.0 - 100.0 fL   MCH 28.8 26.0 - 34.0 pg   MCHC 32.5 30.0 - 36.0 g/dL   RDW 40.913.7 81.111.5 - 91.415.5 %   Platelets 453 (H) 150 - 400 K/uL  CBC  Result Value Ref Range   WBC 6.1 4.0 - 10.5 K/uL   RBC 3.24 (L) 4.22 - 5.81 MIL/uL   Hemoglobin 9.3 (L) 13.0 - 17.0 g/dL   HCT 78.228.7 (L) 95.639.0 - 21.352.0 %   MCV 88.6 78.0 - 100.0 fL   MCH 28.7 26.0 - 34.0 pg   MCHC 32.4 30.0 - 36.0 g/dL   RDW 08.613.6 57.811.5 - 46.915.5 %   Platelets 428 (H) 150 - 400 K/uL  Glucose, capillary  Result Value Ref Range   Glucose-Capillary 111 (H) 65 - 99 mg/dL  Glucose, capillary  Result Value Ref Range   Glucose-Capillary 92 65 - 99 mg/dL    Discharge Medications:    lansoprazole (PREVACID) 30 MG capsule Take 30 mg by mouth daily at 12 noon.    . methocarbamol (ROBAXIN) 500 MG tablet Take 1 tablet (500 mg total) by mouth 3 (three) times daily as needed. 60 tablet 1  .      . oxyCODONE-acetaminophen (PERCOCET/ROXICET) 5-325 MG tablet Take 1-2 tablets by mouth every 8 (eight) hours as needed for severe pain. 30 tablet 0  Aspirin 81 mg PO BID   Diagnostic Studies: Dg Knee Right  Port  Result Date: 10/23/2015 CLINICAL DATA:  Postop day 0 right total knee arthroplasty. EXAM: PORTABLE RIGHT KNEE - 1-2 VIEW COMPARISON:  None. FINDINGS: Anatomic alignment post right total knee arthroplasty. No acute complicating features. IMPRESSION: Anatomic lumbar post right total knee arthroplasty without acute complicating features. Electronically Signed   By: Hulan Saashomas  Lawrence M.D.   On: 10/23/2015 14:12    Disposition: 06-Home-Health Care Svc    Follow-up Information    NORRIS,STEVEN R, MD. Call in 2 weeks.   Specialty:  Orthopedic Surgery Why:  [phone] Contact information: [address] [place] [address] [phone]            Signed: Thea GistDIXON,Salisha Bardsley B 11/16/2015, 7:39 AM

## 2015-11-16 NOTE — Care Management Note (Signed)
Case Management Note  Patient Details  Name: Tommy Odom MRN: [id number] Date of Birth: 05/03/1960  Subjective/Objective:  55 yr old gentleman s/p right knee hematoma  Evacuation, I&D with  manipulation.                Action/Plan: Patient is under worker's comp. Patient has rolling walker, 3in1, states CPM has been delivered. Case manager contacted patient's [phone], will fax OP note, DC summary and orders to him at [phone]. Patient's ID Case # is [id number]. Case manager also spoke with Herbert SetaHeather [phone] - at [facility] [employer] to confirm home health has been arranged, [facility].  Patient has already received a call from the therapist Kathlene NovemberMike.   Expected Discharge Date:   11/16/15               Expected Discharge Plan:  Home w Home Health Services  In-House Referral:     Discharge planning Services  CM Consult  Post Acute Care Choice:  Home Health Choice offered to:  (S)  (Patient has DME)  DME Arranged:    DME Agency:     HH Arranged:  PT [facility]  Status of Service:  Completed, signed off  If discussed at Long Length of Stay Meetings, dates discussed:    Additional Comments:  Durenda GuthrieBrady, Onita Pfluger Naomi, RN 11/16/2015, 10:16 AM

## 2015-11-16 NOTE — Progress Notes (Signed)
Tommy RileHaraldo S Odom to be D/C'd Home per MD order.  Discussed with the patient and all questions fully answered.  VSS, Skin clean, dry and intact without evidence of skin break down, no evidence of skin tears noted. IV catheter discontinued intact. Site without signs and symptoms of complications. Dressing and pressure applied.  An After Visit Summary was printed and given to the patient. Patient received prescription.  D/c education completed with patient/family including follow up instructions, medication list, d/c activities limitations if indicated, with other d/c instructions as indicated by MD - patient able to verbalize understanding, all questions fully answered.   Patient instructed to return to ED, call 911, or call MD for any changes in condition.   Patient escorted via WC, and D/C home via private auto.  Milas HockShatara Daleysa Kristiansen 11/16/2015 10:44 AM

## 2015-11-16 NOTE — Anesthesia Postprocedure Evaluation (Addendum)
Anesthesia Post Note  Patient: Tommy Odom  Procedure(s) Performed: Procedure(s) (LRB): RIGHT KNEE OPEN IRRIGATION AND DEBRIDEMENT WITH MANIPULATION (Right)  Patient location during evaluation: PACU Anesthesia Type: General Level of consciousness: awake and alert Pain management: pain level controlled Vital Signs Assessment: post-procedure vital signs reviewed and stable Respiratory status: spontaneous breathing, nonlabored ventilation, respiratory function stable and patient connected to nasal cannula oxygen Cardiovascular status: blood pressure returned to baseline and stable Postop Assessment: no signs of nausea or vomiting Anesthetic complications: no    Last Vitals:  Vitals:   11/15/15 2046 11/16/15 0552  BP: 135/76 123/69  Pulse: 90 71  Resp:    Temp: 37 C 37.1 C    Last Pain:  Vitals:   11/16/15 1016  TempSrc:   PainSc: 0-No pain                 Bonita Quinichard S Latravis Grine

## 2016-12-04 IMAGING — CT CT RENAL STONE PROTOCOL
2 of 4 series · 16 of 46 positions shown, 18 images · non-contrast
Comparison: None.

CLINICAL DATA: Left flank pain.

EXAM:
CT ABDOMEN AND PELVIS WITHOUT CONTRAST
TECHNIQUE: Multidetector CT imaging of the abdomen and pelvis was performed
following the standard protocol without IV contrast.

[Series 2: routine abd pel with · axial · 0.73mm/px · z∈[-526,-61]mm · 13 of 103 slices shown, 15 images]
[im 5/103  soft-tissue]
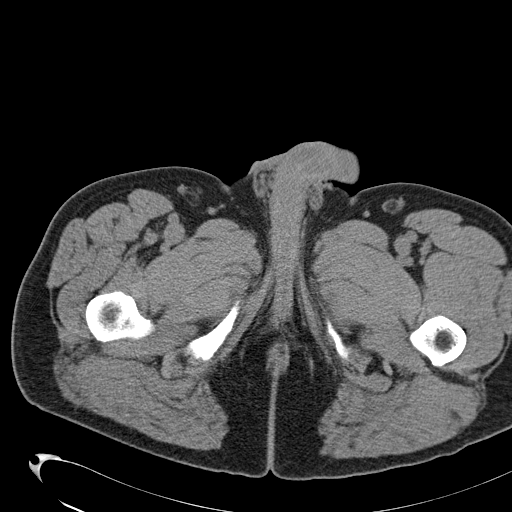
[im 5/103  bone]
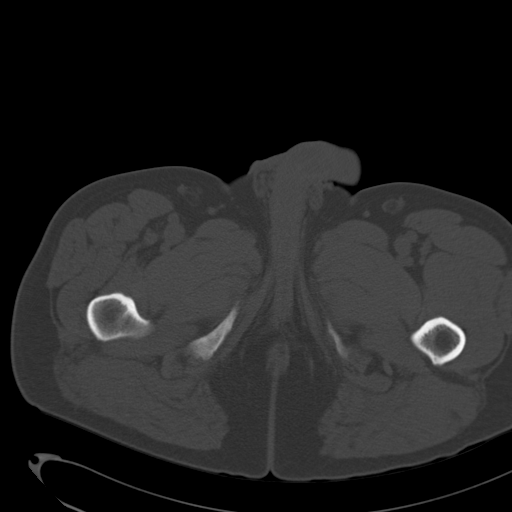
[im 13/103  soft-tissue]
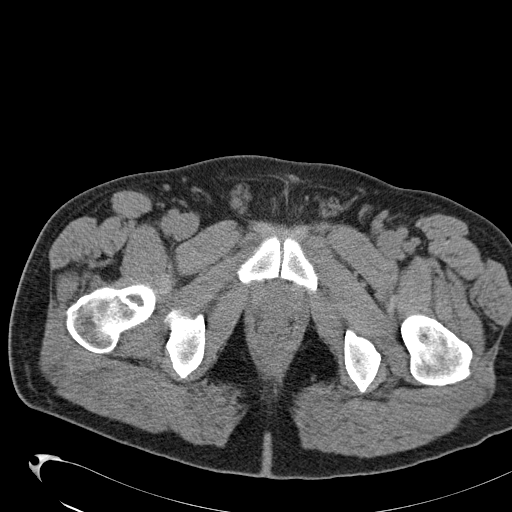
[im 21/103  soft-tissue]
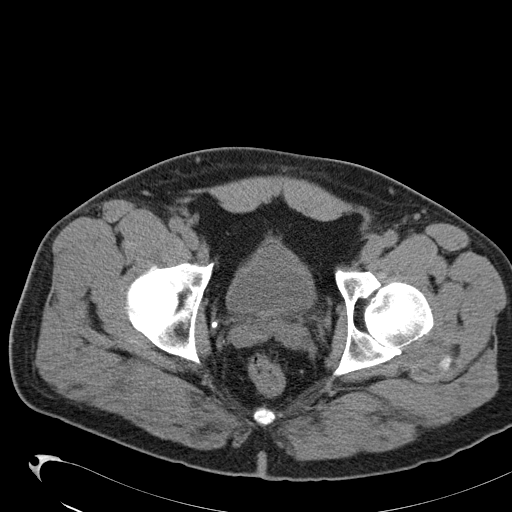
[im 29/103  soft-tissue]
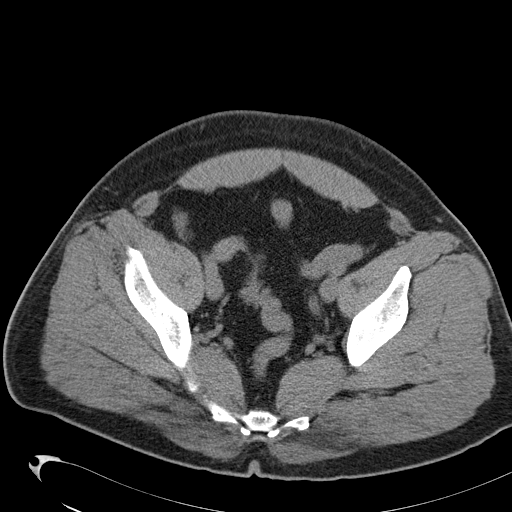
[im 37/103  soft-tissue]
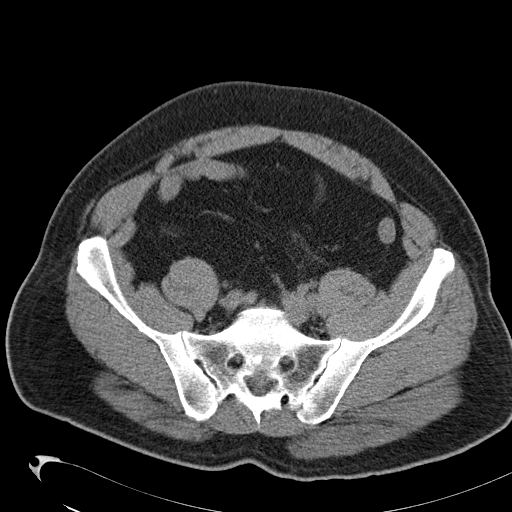
[im 45/103  soft-tissue]
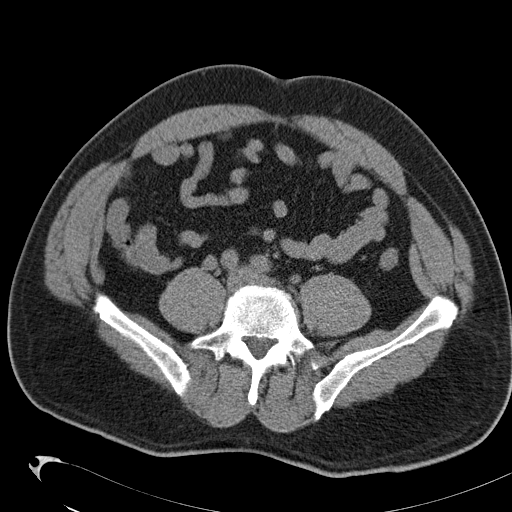
[im 54/103  soft-tissue]
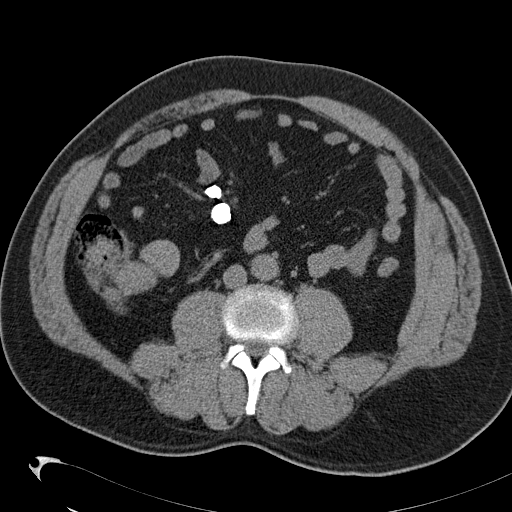
[im 58/103  soft-tissue]
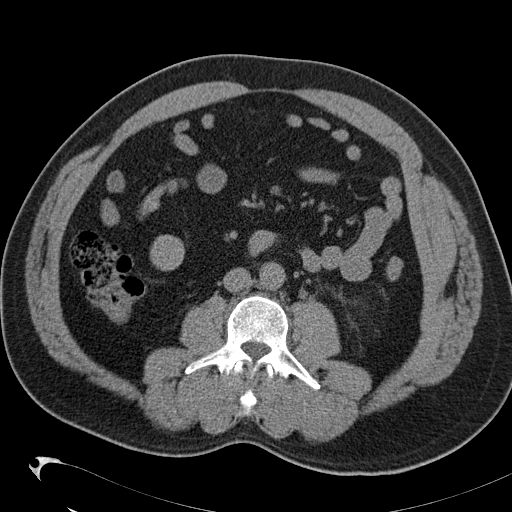
[im 66/103  soft-tissue]
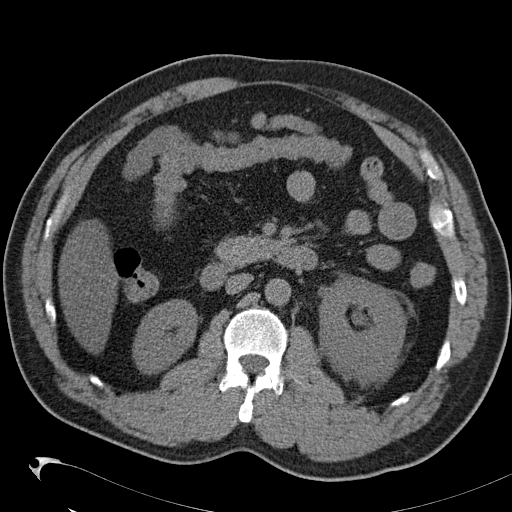
[im 66/103  bone]
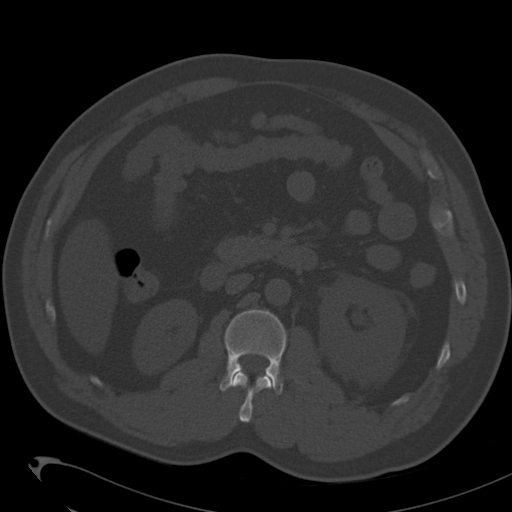
[im 74/103  soft-tissue]
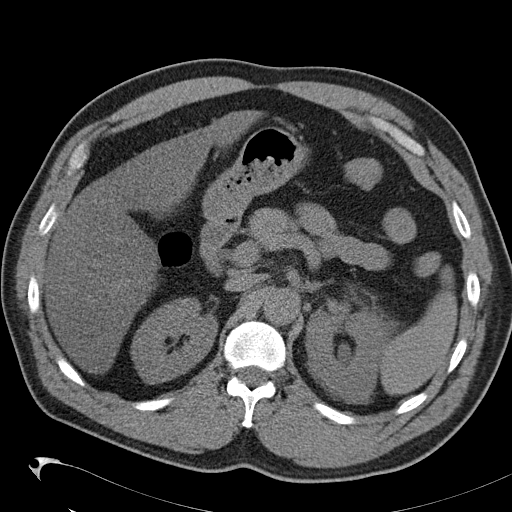
[im 82/103  soft-tissue]
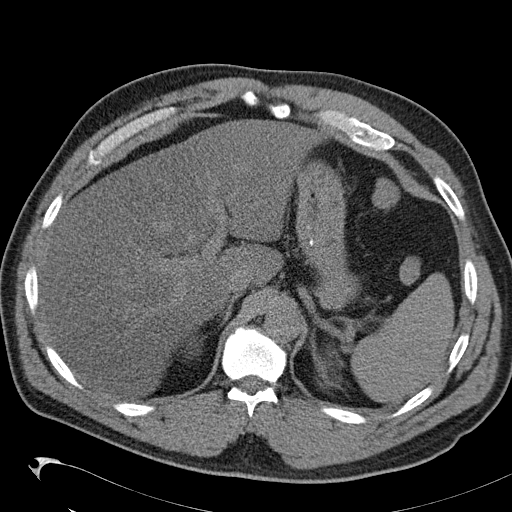
[im 90/103  soft-tissue]
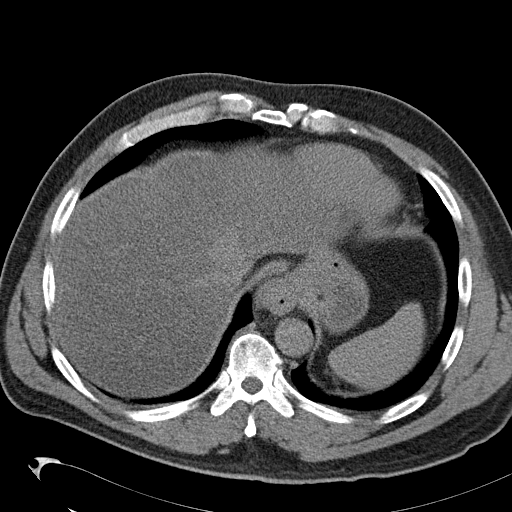
[im 98/103  soft-tissue]
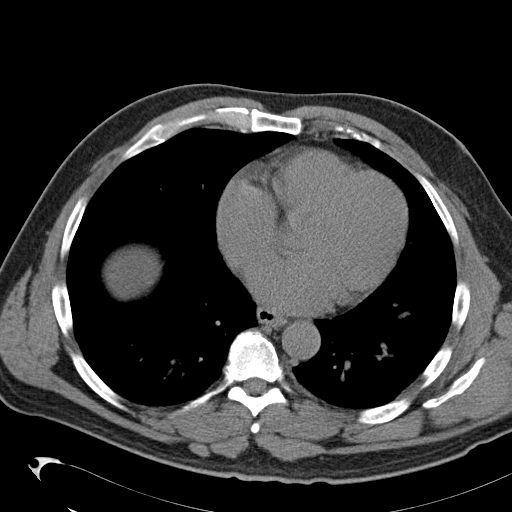

[Series 4: coronal · coronal · 0.81mm/px · 3 of 153 slices shown]
[im 51/153  soft-tissue]
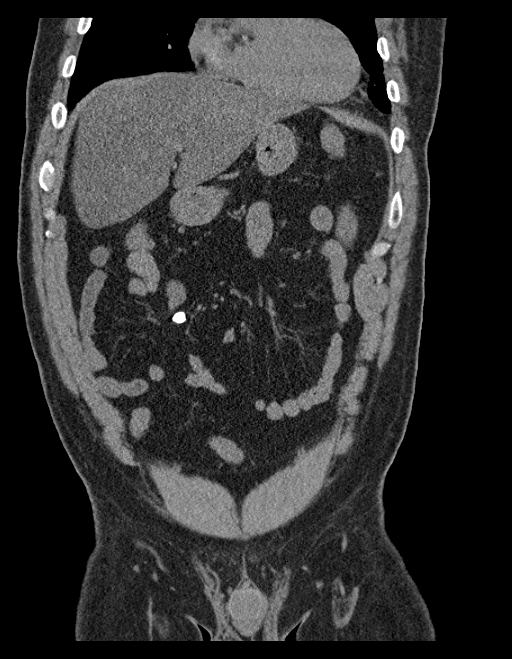
[im 68/153  soft-tissue]
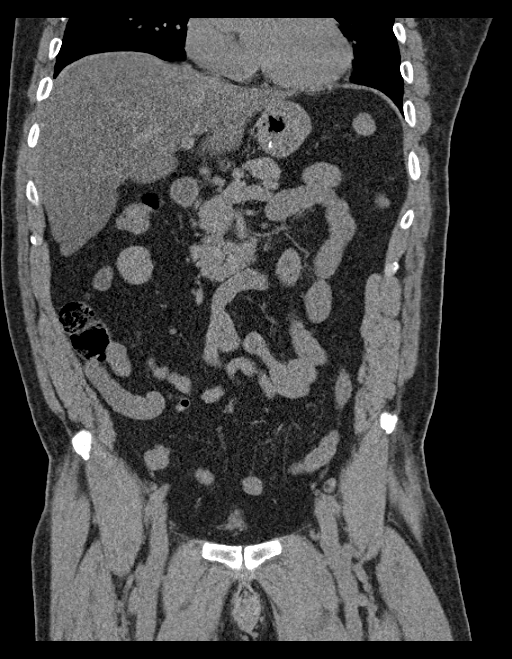
[im 85/153  soft-tissue]
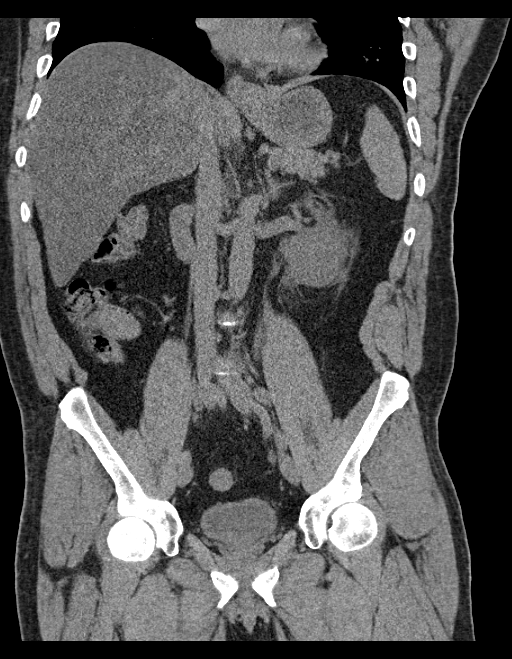

[16 of 46 positions shown; findings below may reference images not displayed]

FINDINGS: There is a 4.6 mm nodule in the periphery of the right lung on
series 3, image 4. There is a small hiatal hernia. Lung bases are
otherwise normal.

There is a fat containing umbilical hernia. No free air or free
fluid.

No stones are seen in either kidney. There is mild hydronephrosis
and perinephric stranding associated with the left kidney. There is
also mild ureterectasis and periureteral stranding on the left.
There is a 2 mm stone at the left UVJ accounting for these findings.
No hydronephrosis, perinephric stranding, ureterectasis, or ureteral
stones on the right.

Hepatic steatosis is identified. Patient is status post
cholecystectomy. The spleen, adrenal glands, and pancreas are
normal. No aneurysm or adenopathy. Two calcified nodes are seen in
the mesentery with no soft tissue component and no desmoplastic
reaction. These no are of doubtful significance. The remainder of
the stomach is normal. The small bowel is unremarkable. The colon
and appendix are normal.

No adenopathy or mass in the pelvis. The prostate, seminal vesicles,
and remainder of the bladder normal.

There is a lucent lesion in the right femoral head with a sclerotic
rim/narrow zone of transition. No soft tissue component is
identified. No other acute bony abnormalities.
IMPRESSION: 1. 2 mm stones at the left UVJ with mild obstruction accounting for
the patient's symptoms.
2. 4.6 mm nodule in the right lung base. See below for follow-up
recommendations.
3. Lucent lesion with narrow zone of transition the right femoral
head suggest a benign etiology.

No follow-up needed if patient is low-risk. Non-contrast chest CT
can be considered in 12 months if patient is high-risk. This
recommendation follows the consensus statement: Guidelines for
Management of Incidental Pulmonary Nodules Detected on CT
Images:From the [HOSPITAL] 2194; published online before
print (10.1148/radiol.[id number]).

## 2017-03-17 IMAGING — CR DG KNEE 1-2V PORT*R*
2 series · 2 of 2 positions shown · non-contrast
Comparison: None.

CLINICAL DATA: Postop day 0 right total knee arthroplasty.

EXAM:
PORTABLE RIGHT KNEE - 1-2 VIEW

[AP]
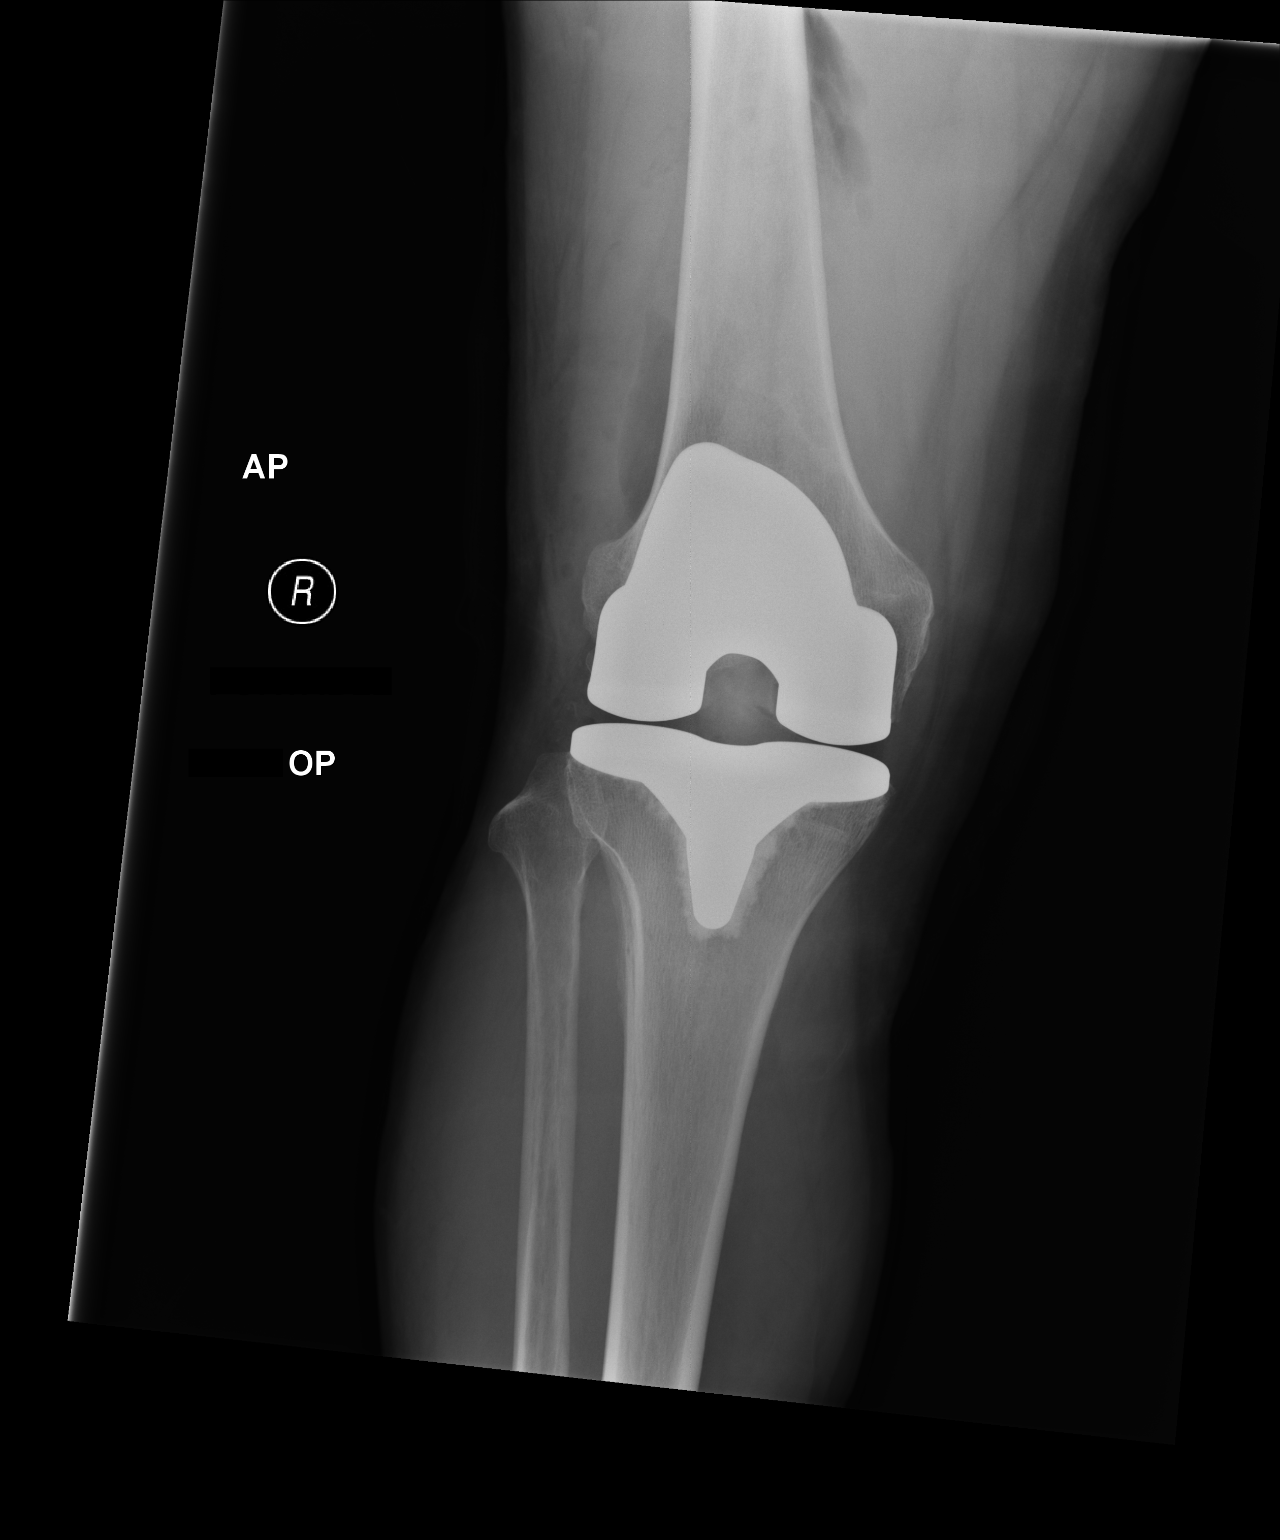

[xtable lateral]
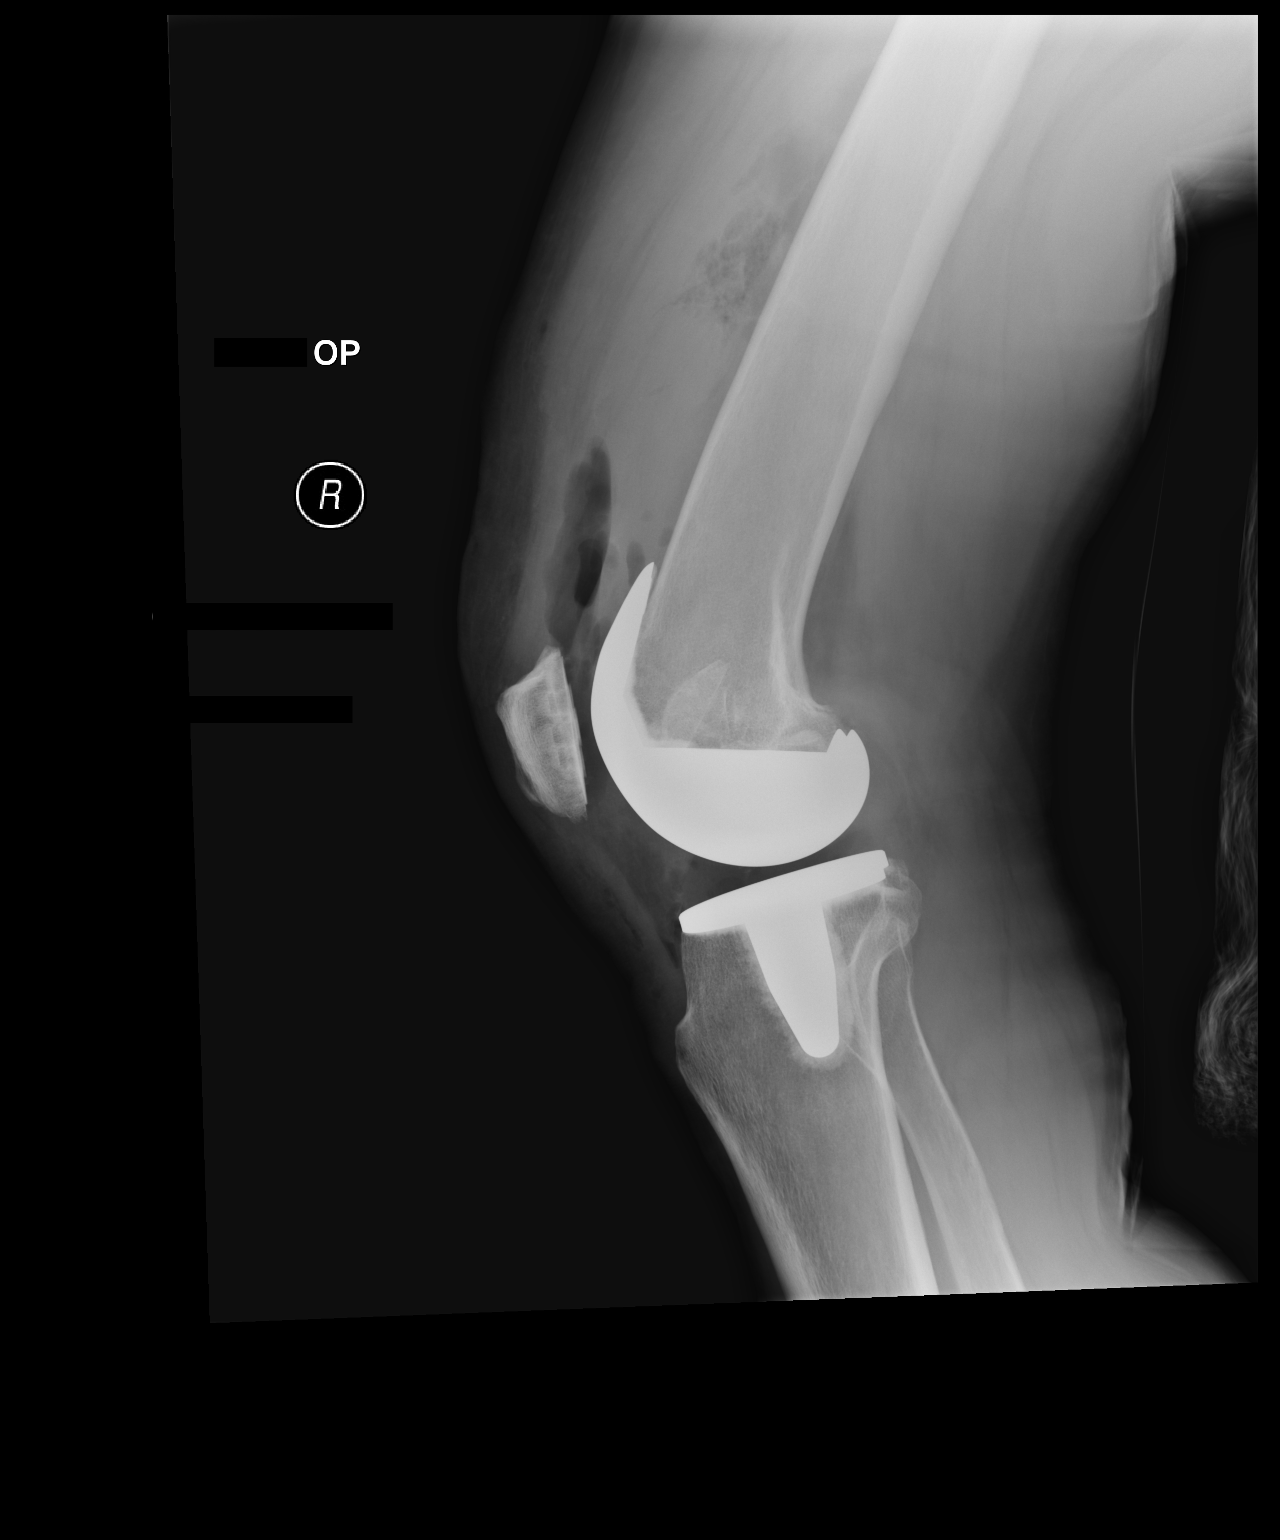

[2 of 2 positions shown; findings below may reference images not displayed]

FINDINGS: Anatomic alignment post right total knee arthroplasty. No acute
complicating features.
IMPRESSION: Anatomic lumbar post right total knee arthroplasty without acute
complicating features.
